# Patient Record
Sex: Female | Born: 1990
Health system: Southern US, Community
[De-identification: ages and names within clinical notes are randomized; demographics above are authoritative.]

## PROBLEM LIST (undated history)

## (undated) DIAGNOSIS — J45909 Unspecified asthma, uncomplicated: Secondary | ICD-10-CM

## (undated) HISTORY — PX: WISDOM TOOTH EXTRACTION: SHX21

---

## 2017-03-12 DIAGNOSIS — F988 Other specified behavioral and emotional disorders with onset usually occurring in childhood and adolescence: Secondary | ICD-10-CM | POA: Diagnosis not present

## 2017-03-12 DIAGNOSIS — E669 Obesity, unspecified: Secondary | ICD-10-CM | POA: Diagnosis not present

## 2017-03-12 DIAGNOSIS — M75101 Unspecified rotator cuff tear or rupture of right shoulder, not specified as traumatic: Secondary | ICD-10-CM | POA: Diagnosis not present

## 2017-03-12 DIAGNOSIS — Z7689 Persons encountering health services in other specified circumstances: Secondary | ICD-10-CM | POA: Diagnosis not present

## 2017-07-06 DIAGNOSIS — L03039 Cellulitis of unspecified toe: Secondary | ICD-10-CM | POA: Diagnosis not present

## 2017-09-30 DIAGNOSIS — M79671 Pain in right foot: Secondary | ICD-10-CM | POA: Diagnosis not present

## 2017-10-03 ENCOUNTER — Ambulatory Visit: Payer: Self-pay | Admitting: Family Medicine

## 2017-10-03 NOTE — Progress Notes (Deleted)
  Tawana ScaleZach [name] D.O.  [facility] [address] [place], [place] [address] Phone: [phone] Subjective:    I'm seeing this patient by the request  of:    CC:   BJY:NWGNFAOZHYHPI:Subjective  Rebekah ReusMargaret [name] is a 26 y.o. female coming in with complaint of ***  Onset-  Location Duration-  Character- Aggravating factors- Reliving factors-  Therapies tried-  Severity-     No past medical history on file. *** The histories are not reviewed yet. Please review them in the "History" navigator section and refresh this SmartLink. Social History   Socioeconomic History  . Marital status: Unknown    Spouse name: Not on file  . Number of children: Not on file  . Years of education: Not on file  . Highest education level: Not on file  Social Needs  . Financial resource strain: Not on file  . Food insecurity - worry: Not on file  . Food insecurity - inability: Not on file  . Transportation needs - medical: Not on file  . Transportation needs - non-medical: Not on file  Occupational History  . Not on file  Tobacco Use  . Smoking status: Not on file  Substance and Sexual Activity  . Alcohol use: Not on file  . Drug use: Not on file  . Sexual activity: Not on file  Other Topics Concern  . Not on file  Social History Narrative  . Not on file   Allergies not on file No family history on file.   Past medical history, social, surgical and family history all reviewed in electronic medical record.  No pertanent information unless stated regarding to the chief complaint.   Review of Systems:Review of systems updated and as accurate as of 10/03/17  No headache, visual changes, nausea, vomiting, diarrhea, constipation, dizziness, abdominal pain, skin rash, fevers, chills, night sweats, weight loss, swollen lymph nodes, body aches, joint swelling, muscle aches, chest pain, shortness of breath, mood changes.   Objective  There were no vitals taken for this visit. Systems examined below as  of 10/03/17   General: No apparent distress alert and oriented x3 mood and affect normal, dressed appropriately.  HEENT: Pupils equal, extraocular movements intact  Respiratory: Patient's speak in full sentences and does not appear short of breath  Cardiovascular: No lower extremity edema, non tender, no erythema  Skin: Warm dry intact with no signs of infection or rash on extremities or on axial skeleton.  Abdomen: Soft nontender  Neuro: Cranial nerves II through XII are intact, neurovascularly intact in all extremities with 2+ DTRs and 2+ pulses.  Lymph: No lymphadenopathy of posterior or anterior cervical chain or axillae bilaterally.  Gait normal with good balance and coordination.  MSK:  Non tender with full range of motion and good stability and symmetric strength and tone of shoulders, elbows, wrist, hip, knee and ankles bilaterally.     Impression and Recommendations:     This case required medical decision making of moderate complexity.      Note: This dictation was prepared with Dragon dictation along with smaller phrase technology. Any transcriptional errors that result from this process are unintentional.

## 2017-10-04 ENCOUNTER — Ambulatory Visit: Payer: Self-pay | Admitting: Family Medicine

## 2017-10-11 ENCOUNTER — Ambulatory Visit: Payer: BLUE CROSS/BLUE SHIELD | Admitting: Family Medicine

## 2017-10-11 ENCOUNTER — Ambulatory Visit: Payer: Self-pay

## 2017-10-11 ENCOUNTER — Other Ambulatory Visit: Payer: Self-pay

## 2017-10-11 ENCOUNTER — Encounter: Payer: Self-pay | Admitting: Family Medicine

## 2017-10-11 VITALS — BP 130/72 | HR 91 | Ht 61.0 in | Wt 185.0 lb

## 2017-10-11 DIAGNOSIS — M79671 Pain in right foot: Secondary | ICD-10-CM

## 2017-10-11 DIAGNOSIS — G5781 Other specified mononeuropathies of right lower limb: Secondary | ICD-10-CM | POA: Insufficient documentation

## 2017-10-11 DIAGNOSIS — G5761 Lesion of plantar nerve, right lower limb: Secondary | ICD-10-CM

## 2017-10-11 MED ORDER — GABAPENTIN 100 MG PO CAPS: 200.0000 mg | ORAL_CAPSULE | Freq: Every day | ORAL | 3 refills | Status: DC

## 2017-10-11 MED ORDER — DICLOFENAC SODIUM 2 % TD SOLN: 2.0000 g | Freq: Two times a day (BID) | TRANSDERMAL | 3 refills | Status: DC

## 2017-10-11 NOTE — Progress Notes (Signed)
Rebekah ScaleZach Celes [name] D.O. [facility] [address] [place], [place] [address] Phone: [phone] Subjective:      BJ:YNWGCC:foot  Pain  NFA:OZHYQMVHQIHPI:Subjective  Ferne ReusMargaret [name] is a 26 y.o. female coming in with complaint of right foot pain. Foot swells causing her to use different shoes. She gets sharp pains bilaterally at night. The right is more painful. Getting out the bed in the morning is the worse. Heat and Ice makes it painful. She got a boot but could not wear it do to work and driving. She has xrays. She does get numbness and tingling bilaterally but the right worse.   Onset- 1 month ago Location- Top of the foot and medial arch Character- Throbbing while she's working Aggravating factors- lateral pain with ankle circles  Reliving factors-seems to get a little better if not so much walking Severity-6 out of 10     History reviewed. No pertinent past medical history. History reviewed. No pertinent surgical history. Social History   Socioeconomic History  . Marital status: Married    Spouse name: None  . Number of children: None  . Years of education: None  . Highest education level: None  Social Needs  . Financial resource strain: None  . Food insecurity - worry: None  . Food insecurity - inability: None  . Transportation needs - medical: None  . Transportation needs - non-medical: None  Occupational History  . None  Tobacco Use  . Smoking status: Never Smoker  . Smokeless tobacco: Never Used  Substance and Sexual Activity  . Alcohol use: None  . Drug use: None  . Sexual activity: None  Other Topics Concern  . None  Social History Narrative  . None   Not on File History reviewed. No pertinent family history.   Past medical history, social, surgical and family history all reviewed in electronic medical record.  No pertanent information unless stated regarding to the chief complaint.   Review of Systems:Review of systems updated and as accurate as of 10/11/17  No  headache, visual changes, nausea, vomiting, diarrhea, constipation, dizziness, abdominal pain, skin rash, fevers, chills, night sweats, weight loss, swollen lymph nodes, body aches, joint swelling, muscle aches, chest pain, shortness of breath, mood changes.   Objective  Blood pressure 130/72, pulse 91, height 5\' 1"  (1.549 m), weight 185 lb (83.9 kg), SpO2 98 %. Systems examined below as of 10/11/17   General: No apparent distress alert and oriented x3 mood and affect normal, dressed appropriately.  HEENT: Pupils equal, extraocular movements intact  Respiratory: Patient's speak in full sentences and does not appear short of breath  Cardiovascular: No lower extremity edema, non tender, no erythema  Skin: Warm dry intact with no signs of infection or rash on extremities or on axial skeleton.  Abdomen: Soft nontender  Neuro: Cranial nerves II through XII are intact, neurovascularly intact in all extremities with 2+ DTRs and 2+ pulses.  Lymph: No lymphadenopathy of posterior or anterior cervical chain or axillae bilaterally.  Gait normal with good balance and coordination.  MSK:  Non tender with full range of motion and good stability and symmetric strength and tone of shoulders, elbows, wrist, hip, knee and ankles bilaterally.  Left foot exam shows the patient does have severe breakdown of the transverse arch.  Patient does have mild breakdown of the longitudinal arch.  Positive squeeze test.  Significant tenderness over the third fourth and fifth metatarsals.  Limited musculoskeletal ultrasound was performed and interpreted by Judi SaaZachary M Aryaman [name]  Limited ultrasound of patient's foot shows the patient does have a very large neuroma between the third and fourth metatarsal heads.  Significant pain to palpation.  [id number]; 15 additional minutes spent for Therapeutic exercises as stated in above notes.  This included exercises focusing on stretching, strengthening, with significant focus on eccentric  aspects.   Long term goals include an improvement in range of motion, strength, endurance as well as avoiding reinjury. Patient's frequency would include in 1-2 times a day, 3-5 times a week for a duration of 6-12 weeks. Exercises for the foot include:  Stretches to help lengthen the lower leg and plantar fascia areas Theraband exercises for the lower leg and ankle to help strengthen the surrounding area- dorsiflexion, plantarflexion, inversion, eversion Massage rolling on the plantar surface of the foot with a frozen bottle, tennis ball or golf ball therabband given  Towel or marble pick-ups to strengthen the plantar surface of the foot Weight bearing exercises to increase balance and overall stability   Proper technique shown and discussed handout in great detail with ATC.  All questions were discussed and answered.      Impression and Recommendations:     This case required medical decision making of moderate complexity.      Note: This dictation was prepared with Dragon dictation along with smaller phrase technology. Any transcriptional errors that result from this process are unintentional.

## 2017-10-11 NOTE — Patient Instructions (Signed)
Good to see you.  Ice 20 minutes 2 times daily. Usually after activity and before bed. Avoid being barefoot Good shoes with rigid bottom.  Rebekah HarnessKeen, Dansko, Merrell or New balance greater then 700 Spenco orthotics "total support" online would be great  pennsaid pinkie amount topically 2 times daily as needed.  Gabapentin 200mg  at night will help with the nerve pain  Exercises 3 times a week.  It will get better but will take 1-2 months See me again in 4 weeks Happy holidays!

## 2017-10-11 NOTE — Assessment & Plan Note (Signed)
Discussed the prognosis as well as the different treatment options.  Patient is elected with conservative therapy including over-the-counter orthotics, rigid sole shoes, topical anti-inflammatories, icing regimen, home exercises and patient work with Event organiserathletic trainer.  We discussed gabapentin at night to help with the nerve pain.  Patient will come back and see me again in 4-6 weeks.  If no improvement will consider custom orthotics, physical therapy and potential injections.

## 2017-11-07 NOTE — Progress Notes (Signed)
Tawana ScaleZach Smith D.O. [facility] [address] [place], [place] [address] Phone: [phone] Subjective:     CC: foot pain follow up   OZH:YQMVHQIONGHPI:Subjective  Ferne ReusMargaret Davis is a [age] y.o. female coming in with complaint of foot pain.  Was found to have more of a neuroma.  Patient was given topical anti-inflammatories, gabapentin, home exercises and we discussed over-the-counter orthotics.  Patient states continuing to have pain.  Patient says severe.  Does 20% better since taking the gabapentin.  Still hurts after standing for a long amount of time.   No past medical history on file. No past surgical history on file. Social History   Socioeconomic History  . Marital status: Married    Spouse name: Not on file  . Number of children: Not on file  . Years of education: Not on file  . Highest education level: Not on file  Social Needs  . Financial resource strain: Not on file  . Food insecurity - worry: Not on file  . Food insecurity - inability: Not on file  . Transportation needs - medical: Not on file  . Transportation needs - non-medical: Not on file  Occupational History  . Not on file  Tobacco Use  . Smoking status: Never Smoker  . Smokeless tobacco: Never Used  Substance and Sexual Activity  . Alcohol use: Not on file  . Drug use: Not on file  . Sexual activity: Not on file  Other Topics Concern  . Not on file  Social History Narrative  . Not on file   Not on File No family history on file.   Past medical history, social, surgical and family history all reviewed in electronic medical record.  No pertanent information unless stated regarding to the chief complaint.   Review of Systems:Review of systems updated and as accurate as of 11/07/17  No headache, visual changes, nausea, vomiting, diarrhea, constipation, dizziness, abdominal pain, skin rash, fevers, chills, night sweats, weight loss, swollen lymph nodes, body aches, joint swelling, muscle aches, chest pain,  shortness of breath, mood changes.   Objective  There were no vitals taken for this visit. Systems examined below as of 11/07/17   General: No apparent distress alert and oriented x3 mood and affect normal, dressed appropriately.  HEENT: Pupils equal, extraocular movements intact  Respiratory: Patient's speak in full sentences and does not appear short of breath  Cardiovascular: No lower extremity edema, non tender, no erythema  Skin: Warm dry intact with no signs of infection or rash on extremities or on axial skeleton.  Abdomen: Soft nontender  Neuro: Cranial nerves II through XII are intact, neurovascularly intact in all extremities with 2+ DTRs and 2+ pulses.  Lymph: No lymphadenopathy of posterior or anterior cervical chain or axillae bilaterally.  Gait normal with good balance and coordination.  MSK:  Non tender with full range of motion and good stability and symmetric strength and tone of shoulders, elbows, wrist, hip, knee and ankles bilaterally.  Exam shows pes planus still noted.  Overpronation.  Breakdown of the transverse arch.  Severe pain with positive squeeze test of the right foot.  Pain to palpation between the third and fourth metatarsal heads.  Procedure: Real-time Ultrasound Guided Injection of right foot neuroma Device: GE Logiq Q7 Ultrasound guided injection is preferred based studies that show increased duration, increased effect, greater accuracy, decreased procedural pain, increased response rate, and decreased cost with ultrasound guided versus blind injection.  Verbal informed consent obtained.  Time-out conducted.  Noted no overlying erythema, induration, or other signs of local infection.  Skin prepped in a sterile fashion.  Local anesthesia: Topical Ethyl chloride.  With sterile technique and under real time ultrasound guidance: With a 25-gauge half inch needle injected with 0.5 cc of 0.5% Marcaine and 0.5 cc of Kenalog 40 mg/dL Completed without difficulty    Pain immediately resolved suggesting accurate placement of the medication.  Advised to call if fevers/chills, erythema, induration, drainage, or persistent bleeding.  Images permanently stored and available for review in the ultrasound unit.  Impression: Technically successful ultrasound guided injection.    Impression and Recommendations:     This case required medical decision making of moderate complexity.      Note: This dictation was prepared with Dragon dictation along with smaller phrase technology. Any transcriptional errors that result from this process are unintentional.

## 2017-11-08 ENCOUNTER — Ambulatory Visit: Payer: Self-pay

## 2017-11-08 ENCOUNTER — Encounter: Payer: Self-pay | Admitting: Family Medicine

## 2017-11-08 ENCOUNTER — Ambulatory Visit: Payer: BLUE CROSS/BLUE SHIELD | Admitting: Family Medicine

## 2017-11-08 VITALS — BP 114/70 | HR 85 | Ht 61.0 in | Wt 185.0 lb

## 2017-11-08 DIAGNOSIS — M79673 Pain in unspecified foot: Secondary | ICD-10-CM

## 2017-11-08 DIAGNOSIS — G5781 Other specified mononeuropathies of right lower limb: Secondary | ICD-10-CM

## 2017-11-08 DIAGNOSIS — G5761 Lesion of plantar nerve, right lower limb: Secondary | ICD-10-CM

## 2017-11-08 NOTE — Assessment & Plan Note (Addendum)
Given injection today.  Tolerated the procedure well.  We discussed icing regimen and home exercises.  We discussed which activities of doing which wants to avoid.  Continue topical anti-inflammatories.  Continue the gabapentin.  Follow-up again in 4 weeks

## 2017-11-08 NOTE — Patient Instructions (Signed)
Good to see you  Rebekah [name] is your friend.  Continue the exercises and the gabapentin  Injected the nerve today  Good shoes iorn 65mg  with 500mg  of vitamin C at least 3 days before your period to 3 days after your period.  See me again in 4 -6 weeks

## 2017-11-22 DIAGNOSIS — F988 Other specified behavioral and emotional disorders with onset usually occurring in childhood and adolescence: Secondary | ICD-10-CM | POA: Diagnosis not present

## 2017-11-22 DIAGNOSIS — Z79899 Other long term (current) drug therapy: Secondary | ICD-10-CM | POA: Diagnosis not present

## 2017-12-06 ENCOUNTER — Ambulatory Visit: Payer: BLUE CROSS/BLUE SHIELD | Admitting: Family Medicine

## 2017-12-08 NOTE — Progress Notes (Deleted)
  Tawana ScaleZach Aquita Simmering D.O. [facility] [address] [place], [place] [address] Phone: [phone] Subjective:      CC foo pain   ONG:EXBMWUXLKGHPI:Subjective  Ferne ReusMargaret Davis is a 27 y.o. female coming in with complaint of pain.  Patient was having foot pain.  Found to have a neuroma third interspace of the right foot.  Given injection and started on gabapentin.  Patient states     No past medical history on file. No past surgical history on file. Social History   Socioeconomic History  . Marital status: Married    Spouse name: Not on file  . Number of children: Not on file  . Years of education: Not on file  . Highest education level: Not on file  Social Needs  . Financial resource strain: Not on file  . Food insecurity - worry: Not on file  . Food insecurity - inability: Not on file  . Transportation needs - medical: Not on file  . Transportation needs - non-medical: Not on file  Occupational History  . Not on file  Tobacco Use  . Smoking status: Never Smoker  . Smokeless tobacco: Never Used  Substance and Sexual Activity  . Alcohol use: Not on file  . Drug use: Not on file  . Sexual activity: Not on file  Other Topics Concern  . Not on file  Social History Narrative  . Not on file   Not on File No family history on file.   Past medical history, social, surgical and family history all reviewed in electronic medical record.  No pertanent information unless stated regarding to the chief complaint.   Review of Systems:Review of systems updated and as accurate as of 12/08/17  No headache, visual changes, nausea, vomiting, diarrhea, constipation, dizziness, abdominal pain, skin rash, fevers, chills, night sweats, weight loss, swollen lymph nodes, body aches, joint swelling, muscle aches, chest pain, shortness of breath, mood changes.   Objective  There were no vitals taken for this visit. Systems examined below as of 12/08/17   General: No apparent distress alert and  oriented x3 mood and affect normal, dressed appropriately.  HEENT: Pupils equal, extraocular movements intact  Respiratory: Patient's speak in full sentences and does not appear short of breath  Cardiovascular: No lower extremity edema, non tender, no erythema  Skin: Warm dry intact with no signs of infection or rash on extremities or on axial skeleton.  Abdomen: Soft nontender  Neuro: Cranial nerves II through XII are intact, neurovascularly intact in all extremities with 2+ DTRs and 2+ pulses.  Lymph: No lymphadenopathy of posterior or anterior cervical chain or axillae bilaterally.  Gait normal with good balance and coordination.  MSK:  Non tender with full range of motion and good stability and symmetric strength and tone of shoulders, elbows, wrist, hip, knee and ankles bilaterally.     Impression and Recommendations:     This case required medical decision making of moderate complexity.      Note: This dictation was prepared with Dragon dictation along with smaller phrase technology. Any transcriptional errors that result from this process are unintentional.

## 2017-12-10 ENCOUNTER — Ambulatory Visit: Payer: BLUE CROSS/BLUE SHIELD | Admitting: Family Medicine

## 2017-12-23 NOTE — Progress Notes (Signed)
Rebekah [name] [address] [place], [place] [address] Phone: [phone] Subjective:    I'm seeing this patient by the request  of:    CC: Right foot pain  OHY:[id number]  Rebekah [name] is a 27 y.o. female coming in with complaint of right foot pain.  Found to have a neuroma of the left foot.  Given injection January 2019.  Started on low-dose gabapentin.  Patient states that she has been wearing different shoes which has been helping. She feels that she is progressing in the right direction.   Patient also is having left hip pain. She said a shelf fell on her and some coworkers and she had to pull herself up and she felt some sharp pain in her hip. She has had pain with movement since then.  States is worsening day by day.  States that even going from laying to sitting up his agony.  Feels like she does not want to move.       No past medical history on file. No past surgical history on file. Social History   Socioeconomic History  . Marital status: Married    Spouse name: None  . Number of children: None  . Years of education: None  . Highest education level: None  Social Needs  . Financial resource strain: None  . Food insecurity - worry: None  . Food insecurity - inability: None  . Transportation needs - medical: None  . Transportation needs - non-medical: None  Occupational History  . None  Tobacco Use  . Smoking status: Never Smoker  . Smokeless tobacco: Never Used  Substance and Sexual Activity  . Alcohol use: None  . Drug use: None  . Sexual activity: None  Other Topics Concern  . None  Social History Narrative  . None   Not on File No family history on file.  No family history of autoimmune disease   Past medical history, social, surgical and family history all reviewed in electronic medical record.  No pertanent information unless stated regarding to the chief complaint.   Review of Systems:Review of systems updated and as  accurate as of 12/24/17  No headache, visual changes, nausea, vomiting, diarrhea, constipation, dizziness skin rash, fevers, chills, night sweats, weight loss, swollen lymph nodes, body aches, joint swelling, muscle aches, chest pain, shortness of breath, mood changes.  Positive change in bowel habits, decreased appetite, abdominal pain  Objective  Blood pressure 122/80, pulse 83, height 5\' 1"  (1.549 m), weight 185 lb (83.9 kg), SpO2 98 %. Systems examined below as of 12/24/17   General: No apparent distress alert and oriented x3 mood and affect normal, dressed appropriately.  HEENT: Pupils equal, extraocular movements intact  Respiratory: Patient's speak in full sentences and does not appear short of breath  Cardiovascular: No lower extremity edema, non tender, no erythema  Skin: Warm dry intact with no signs of infection or rash on extremities or on axial skeleton.  Abdomen: Bowel sounds positive in all 4 quadrants but severe tenderness to palpation in the left lower quadrant to even light palpation.  Patient does have a positive pain to percussion as well as rebound tenderness. Neuro: Cranial nerves II through XII are intact, neurovascularly intact in all extremities with 2+ DTRs and 2+ pulses.  Lymph: No lymphadenopathy of posterior or anterior cervical chain or axillae bilaterally.  Gait cautious splinting patient's left abdominal area. MSK:  Non tender with full range of motion and good stability and symmetric  strength and tone of shoulders, elbows, wrist, hip, knee and ankles bilaterally.      Impression and Recommendations:     This case required medical decision making of moderate complexity.      Note: This dictation was prepared with Dragon dictation along with smaller phrase technology. Any transcriptional errors that result from this process are unintentional.

## 2017-12-24 ENCOUNTER — Encounter: Payer: Self-pay | Admitting: Family Medicine

## 2017-12-24 ENCOUNTER — Ambulatory Visit: Payer: BLUE CROSS/BLUE SHIELD | Admitting: Family Medicine

## 2017-12-24 DIAGNOSIS — R1032 Left lower quadrant pain: Secondary | ICD-10-CM

## 2017-12-24 MED ORDER — GABAPENTIN 100 MG PO CAPS: 200.0000 mg | ORAL_CAPSULE | Freq: Every day | ORAL | 3 refills | Status: DC

## 2017-12-24 NOTE — Patient Instructions (Signed)
Good to see you  I think you will be fine.  Lets get a CT scan Abdomen and pelvis with and without contrast  They will call you  If it gets a lot worse then please go to the emergency room  See me again in 2 months otherwise for the foot.

## 2017-12-24 NOTE — Assessment & Plan Note (Signed)
Left lower abdominal quadrant pain.  Concern with patient having rebound tenderness.  Patient is presenting somewhat like an appendicitis just in the right quadrant.  Discussed diverticulitis is a possibility.  We discussed laboratory workup which patient declined.  Patient knows that if any fever, worsening pain, will need to go to the emergency room.  Will order a CT abdomen and pelvis at this point.  Follow-up again 2 months for the foot

## 2017-12-25 DIAGNOSIS — D72829 Elevated white blood cell count, unspecified: Secondary | ICD-10-CM | POA: Diagnosis not present

## 2017-12-25 DIAGNOSIS — M79604 Pain in right leg: Secondary | ICD-10-CM | POA: Diagnosis not present

## 2017-12-25 DIAGNOSIS — R112 Nausea with vomiting, unspecified: Secondary | ICD-10-CM | POA: Diagnosis not present

## 2017-12-25 DIAGNOSIS — N83291 Other ovarian cyst, right side: Secondary | ICD-10-CM | POA: Diagnosis not present

## 2017-12-25 DIAGNOSIS — R1032 Left lower quadrant pain: Secondary | ICD-10-CM | POA: Diagnosis not present

## 2017-12-25 DIAGNOSIS — R509 Fever, unspecified: Secondary | ICD-10-CM | POA: Diagnosis not present

## 2017-12-25 DIAGNOSIS — R0789 Other chest pain: Secondary | ICD-10-CM | POA: Diagnosis not present

## 2017-12-25 DIAGNOSIS — R079 Chest pain, unspecified: Secondary | ICD-10-CM | POA: Diagnosis not present

## 2017-12-25 DIAGNOSIS — R1084 Generalized abdominal pain: Secondary | ICD-10-CM | POA: Diagnosis not present

## 2017-12-25 DIAGNOSIS — R9431 Abnormal electrocardiogram [ECG] [EKG]: Secondary | ICD-10-CM | POA: Diagnosis not present

## 2017-12-25 DIAGNOSIS — M549 Dorsalgia, unspecified: Secondary | ICD-10-CM | POA: Diagnosis not present

## 2018-01-08 DIAGNOSIS — Z09 Encounter for follow-up examination after completed treatment for conditions other than malignant neoplasm: Secondary | ICD-10-CM | POA: Diagnosis not present

## 2018-01-08 DIAGNOSIS — R1032 Left lower quadrant pain: Secondary | ICD-10-CM | POA: Diagnosis not present

## 2018-01-14 ENCOUNTER — Encounter: Payer: Self-pay | Admitting: Family Medicine

## 2018-03-14 DIAGNOSIS — M545 Low back pain: Secondary | ICD-10-CM | POA: Diagnosis not present

## 2018-03-14 DIAGNOSIS — R300 Dysuria: Secondary | ICD-10-CM | POA: Diagnosis not present

## 2018-06-06 DIAGNOSIS — Z1283 Encounter for screening for malignant neoplasm of skin: Secondary | ICD-10-CM | POA: Diagnosis not present

## 2018-06-06 DIAGNOSIS — D485 Neoplasm of uncertain behavior of skin: Secondary | ICD-10-CM | POA: Diagnosis not present

## 2018-06-06 DIAGNOSIS — L578 Other skin changes due to chronic exposure to nonionizing radiation: Secondary | ICD-10-CM | POA: Diagnosis not present

## 2018-06-06 DIAGNOSIS — L02611 Cutaneous abscess of right foot: Secondary | ICD-10-CM | POA: Diagnosis not present

## 2018-07-02 ENCOUNTER — Other Ambulatory Visit: Payer: Self-pay

## 2018-07-02 ENCOUNTER — Ambulatory Visit
Admission: EM | Admit: 2018-07-02 | Discharge: 2018-07-02 | Disposition: A | Discharge status: Home / Self Care | Payer: BLUE CROSS/BLUE SHIELD | Attending: Family Medicine | Admitting: Family Medicine

## 2018-07-02 ENCOUNTER — Encounter: Payer: Self-pay | Admitting: Emergency Medicine

## 2018-07-02 ENCOUNTER — Ambulatory Visit (INDEPENDENT_AMBULATORY_CARE_PROVIDER_SITE_OTHER): Payer: BLUE CROSS/BLUE SHIELD

## 2018-07-02 DIAGNOSIS — M722 Plantar fascial fibromatosis: Secondary | ICD-10-CM | POA: Diagnosis not present

## 2018-07-02 DIAGNOSIS — M79671 Pain in right foot: Secondary | ICD-10-CM

## 2018-07-02 DIAGNOSIS — L84 Corns and callosities: Secondary | ICD-10-CM | POA: Diagnosis not present

## 2018-07-02 NOTE — ED Triage Notes (Signed)
Patient states that she has been having pain at the bottom of her right foot for the past 2-3 weeks.  Patient states that she stepped on some gravel about 3 weeks ago and not sure if she still has some stuck in her foot.

## 2018-07-02 NOTE — ED Provider Notes (Signed)
[facility]    CSN: [id number] Arrival date & time: 07/02/18  1631     History   Chief Complaint Chief Complaint  Patient presents with  . Foot Pain    right    HPI Rebekah [name] is a 27 y.o. female.   27 yo female with a c/o foot pain to the bottom of the foot. States about 2 months ago she walked on some gravel and had some foreign bodies stuck in her foot. States she has a spot on the bottom of her foot that's been very painful and has made her change her gait to avoid pressure to the area.   The history is provided by the patient.  Foot Pain     History reviewed. No pertinent past medical history.  There are no active problems to display for this patient.   Past Surgical History:  Procedure Laterality Date  . WISDOM TOOTH EXTRACTION      OB History   None      Home Medications    Prior to Admission medications   Medication Sig Start Date End Date Taking? Authorizing Provider  amphetamine-dextroamphetamine (ADDERALL XR) 20 MG 24 hr capsule Take by mouth. 04/30/18  Yes [provider]  gabapentin (NEURONTIN) 100 MG capsule Take by mouth. 11/15/17   [provider]    Family History History reviewed. No pertinent family history.  Social History Social History   Tobacco Use  . Smoking status: Never Smoker  . Smokeless tobacco: Never Used  Substance Use Topics  . Alcohol use: Yes  . Drug use: Not on file     Allergies   Sumatriptan succinate   Review of Systems Review of Systems   Physical Exam Triage Vital Signs ED Triage Vitals  Enc Vitals Group     BP 07/02/18 1654 120/88     Pulse Rate 07/02/18 1654 89     Resp 07/02/18 1654 16     Temp 07/02/18 1654 98.6 F (37 C)     Temp Source 07/02/18 1654 Oral     SpO2 07/02/18 1654 99 %     Weight 07/02/18 1650 170 lb (77.1 kg)     Height 07/02/18 1650 5\' 1"  (1.549 m)     Head Circumference --      Peak Flow --      Pain Score 07/02/18 1650 7     Pain  Loc --      Pain Edu? --      Excl. in GC? --    No data found.  Updated Vital Signs BP 120/88 (BP Location: Left Arm)   Pulse 89   Temp 98.6 F (37 C) (Oral)   Resp 16   Ht 5\' 1"  (1.549 m)   Wt 77.1 kg   LMP 07/01/2018 (Exact Date)   SpO2 99%   BMI 32.12 kg/m   Visual Acuity Right Eye Distance:   Left Eye Distance:   Bilateral Distance:    Right Eye Near:   Left Eye Near:    Bilateral Near:     Physical Exam  Constitutional: She appears well-developed and well-nourished. No distress.  Musculoskeletal:       Right foot: There is tenderness (along plantar aspect of foot; and at corn appearing isolated skin lesion on plantar skin). There is normal range of motion, no bony tenderness, no swelling, normal capillary refill, no crepitus, no deformity and no laceration.  Skin: She is not diaphoretic.  Nursing note and vitals reviewed.  UC Treatments / Results  Labs (all labs ordered are listed, but only abnormal results are displayed) Labs Reviewed - No data to display  EKG None  Radiology Dg Foot Complete Right  Result Date: 07/02/2018 CLINICAL DATA:  Persistent plantar foot pain for the past 2-3 weeks after stepping on gravel. EXAM: RIGHT FOOT COMPLETE - 3+ VIEW COMPARISON:  None. FINDINGS: There is no evidence of fracture or dislocation. There is no evidence of arthropathy or other focal bone abnormality. Tiny plantar enthesophyte. Soft tissues are unremarkable. No radiopaque foreign body identified. IMPRESSION: Negative. Electronically Signed   By: Obie Dredge M.D.   On: 07/02/2018 18:03    Procedures Procedures (including critical care time)  Medications Ordered in UC Medications - No data to display  Initial Impression / Assessment and Plan / UC Course  I have reviewed the triage vital signs and the nursing notes.  Pertinent labs & imaging results that were available during my care of the patient were reviewed by me and considered in my medical decision  making (see chart for details).      Final Clinical Impressions(s) / UC Diagnoses   Final diagnoses:  Corn of foot  Plantar fasciitis of right foot    ED Prescriptions    None     1. Foot x-ray result (negative for foreign body) and diagnoses reviewed with patient 2. Recommend supportive treatment with otc corn/callus pads, otc plantar fascitis shoe insert and otc analgesics/NSAIDS 3. Follow up with podiatrist (info given for Dr. Ether Griffins)   4. Follow-up prn if symptoms worsen or don't improve   Controlled Substance Prescriptions [place] Controlled Substance Registry consulted? Not Applicable   Payton Mccallum, MD 07/02/18 [phone]

## 2018-07-03 ENCOUNTER — Encounter: Payer: Self-pay | Admitting: Family Medicine

## 2018-07-23 DIAGNOSIS — M79671 Pain in right foot: Secondary | ICD-10-CM | POA: Diagnosis not present

## 2018-07-23 DIAGNOSIS — D2371 Other benign neoplasm of skin of right lower limb, including hip: Secondary | ICD-10-CM | POA: Diagnosis not present

## 2018-09-23 DIAGNOSIS — Z23 Encounter for immunization: Secondary | ICD-10-CM | POA: Diagnosis not present

## 2018-09-23 DIAGNOSIS — E669 Obesity, unspecified: Secondary | ICD-10-CM | POA: Diagnosis not present

## 2018-09-23 DIAGNOSIS — Z79899 Other long term (current) drug therapy: Secondary | ICD-10-CM | POA: Diagnosis not present

## 2018-09-23 DIAGNOSIS — Z131 Encounter for screening for diabetes mellitus: Secondary | ICD-10-CM | POA: Diagnosis not present

## 2018-09-23 DIAGNOSIS — Z0000 Encounter for general adult medical examination without abnormal findings: Secondary | ICD-10-CM | POA: Diagnosis not present

## 2018-09-23 DIAGNOSIS — F988 Other specified behavioral and emotional disorders with onset usually occurring in childhood and adolescence: Secondary | ICD-10-CM | POA: Diagnosis not present

## 2019-01-11 NOTE — Progress Notes (Deleted)
Rebekah [name] [address] [place], [place] [address] Phone: [phone] Subjective:    I'm seeing this patient by the request  of:    CC: Right foot pain  [id number]  Rebekah [name] is a 28 y.o. female coming in with complaint of ***  Onset-  Location Duration-  Character- Aggravating factors- Reliving factors-  Therapies tried-  Severity-     No past medical history on file. Past Surgical History:  Procedure Laterality Date  . WISDOM TOOTH EXTRACTION     Social History   Socioeconomic History  . Marital status: Married    Spouse name: Not on file  . Number of children: Not on file  . Years of education: Not on file  . Highest education level: Not on file  Occupational History  . Not on file  Social Needs  . Financial resource strain: Not on file  . Food insecurity:    Worry: Not on file    Inability: Not on file  . Transportation needs:    Medical: Not on file    Non-medical: Not on file  Tobacco Use  . Smoking status: Never Smoker  . Smokeless tobacco: Never Used  Substance and Sexual Activity  . Alcohol use: Yes  . Drug use: Not on file  . Sexual activity: Not on file  Lifestyle  . Physical activity:    Days per week: Not on file    Minutes per session: Not on file  . Stress: Not on file  Relationships  . Social connections:    Talks on phone: Not on file    Gets together: Not on file    Attends religious service: Not on file    Active member of club or organization: Not on file    Attends meetings of clubs or organizations: Not on file    Relationship status: Not on file  Other Topics Concern  . Not on file  Social History Narrative   ** Merged History Encounter **       Allergies  Allergen Reactions  . Sumatriptan Succinate Other (See Comments)    Muscle spasms   No family history on file.       Current Outpatient Medications (Other):  .  amphetamine-dextroamphetamine (ADDERALL XR) 20 MG 24 hr  capsule, Take by mouth. .  Diclofenac Sodium (PENNSAID) 2 % SOLN, Place 2 g onto the skin 2 (two) times daily. Marland Kitchen  gabapentin (NEURONTIN) 100 MG capsule, Take 2 capsules (200 mg total) by mouth at bedtime. .  gabapentin (NEURONTIN) 100 MG capsule, Take by mouth.    Past medical history, social, surgical and family history all reviewed in electronic medical record.  No pertanent information unless stated regarding to the chief complaint.   Review of Systems:  No headache, visual changes, nausea, vomiting, diarrhea, constipation, dizziness, abdominal pain, skin rash, fevers, chills, night sweats, weight loss, swollen lymph nodes, body aches, joint swelling, muscle aches, chest pain, shortness of breath, mood changes.   Objective  There were no vitals taken for this visit. Systems examined below as of    General: No apparent distress alert and oriented x3 mood and affect normal, dressed appropriately.  HEENT: Pupils equal, extraocular movements intact  Respiratory: Patient's speak in full sentences and does not appear short of breath  Cardiovascular: No lower extremity edema, non tender, no erythema  Skin: Warm dry intact with no signs of infection or rash on extremities or on axial skeleton.  Abdomen: Soft nontender  Neuro: Cranial nerves II through XII are intact, neurovascularly intact in all extremities with 2+ DTRs and 2+ pulses.  Lymph: No lymphadenopathy of posterior or anterior cervical chain or axillae bilaterally.  Gait normal with good balance and coordination.  MSK:  Non tender with full range of motion and good stability and symmetric strength and tone of shoulders, elbows, wrist, hip, knee and ankles bilaterally.  Foot exam shows   Impression and Recommendations:     This case required medical decision making of moderate complexity. The above documentation has been reviewed and is accurate and complete Judi Saa, DO       Note: This dictation was prepared with  Dragon dictation along with smaller phrase technology. Any transcriptional errors that result from this process are unintentional.

## 2019-01-13 ENCOUNTER — Ambulatory Visit: Payer: BLUE CROSS/BLUE SHIELD | Admitting: Family Medicine

## 2019-01-20 DIAGNOSIS — Z3401 Encounter for supervision of normal first pregnancy, first trimester: Secondary | ICD-10-CM | POA: Diagnosis not present

## 2019-02-13 DIAGNOSIS — Z3401 Encounter for supervision of normal first pregnancy, first trimester: Secondary | ICD-10-CM | POA: Diagnosis not present

## 2019-02-13 DIAGNOSIS — Z3A12 12 weeks gestation of pregnancy: Secondary | ICD-10-CM | POA: Diagnosis not present

## 2019-02-13 DIAGNOSIS — O351XX0 Maternal care for (suspected) chromosomal abnormality in fetus, not applicable or unspecified: Secondary | ICD-10-CM | POA: Diagnosis not present

## 2019-02-13 DIAGNOSIS — Z36 Encounter for antenatal screening for chromosomal anomalies: Secondary | ICD-10-CM | POA: Diagnosis not present

## 2019-02-13 DIAGNOSIS — O3680X0 Pregnancy with inconclusive fetal viability, not applicable or unspecified: Secondary | ICD-10-CM | POA: Diagnosis not present

## 2019-04-03 DIAGNOSIS — O99212 Obesity complicating pregnancy, second trimester: Secondary | ICD-10-CM | POA: Diagnosis not present

## 2019-04-03 DIAGNOSIS — Z363 Encounter for antenatal screening for malformations: Secondary | ICD-10-CM | POA: Diagnosis not present

## 2019-04-03 DIAGNOSIS — E669 Obesity, unspecified: Secondary | ICD-10-CM | POA: Diagnosis not present

## 2019-04-21 DIAGNOSIS — E669 Obesity, unspecified: Secondary | ICD-10-CM | POA: Diagnosis not present

## 2019-04-21 DIAGNOSIS — F909 Attention-deficit hyperactivity disorder, unspecified type: Secondary | ICD-10-CM | POA: Diagnosis not present

## 2019-04-21 DIAGNOSIS — O99211 Obesity complicating pregnancy, first trimester: Secondary | ICD-10-CM | POA: Diagnosis not present

## 2019-04-21 DIAGNOSIS — Z3009 Encounter for other general counseling and advice on contraception: Secondary | ICD-10-CM | POA: Diagnosis not present

## 2019-05-08 DIAGNOSIS — O99212 Obesity complicating pregnancy, second trimester: Secondary | ICD-10-CM | POA: Diagnosis not present

## 2019-05-08 DIAGNOSIS — Z362 Encounter for other antenatal screening follow-up: Secondary | ICD-10-CM | POA: Diagnosis not present

## 2019-05-08 DIAGNOSIS — Z3A24 24 weeks gestation of pregnancy: Secondary | ICD-10-CM | POA: Diagnosis not present

## 2019-05-26 DIAGNOSIS — Z7189 Other specified counseling: Secondary | ICD-10-CM | POA: Diagnosis not present

## 2019-05-26 DIAGNOSIS — Z3403 Encounter for supervision of normal first pregnancy, third trimester: Secondary | ICD-10-CM | POA: Diagnosis not present

## 2019-05-26 DIAGNOSIS — Z23 Encounter for immunization: Secondary | ICD-10-CM | POA: Diagnosis not present

## 2019-06-30 DIAGNOSIS — H5213 Myopia, bilateral: Secondary | ICD-10-CM | POA: Diagnosis not present

## 2019-07-09 DIAGNOSIS — J4520 Mild intermittent asthma, uncomplicated: Secondary | ICD-10-CM | POA: Diagnosis not present

## 2019-07-09 DIAGNOSIS — O368130 Decreased fetal movements, third trimester, not applicable or unspecified: Secondary | ICD-10-CM | POA: Diagnosis not present

## 2019-07-09 DIAGNOSIS — Z3A33 33 weeks gestation of pregnancy: Secondary | ICD-10-CM | POA: Diagnosis not present

## 2019-07-09 DIAGNOSIS — J45909 Unspecified asthma, uncomplicated: Secondary | ICD-10-CM | POA: Diagnosis not present

## 2019-07-23 DIAGNOSIS — E669 Obesity, unspecified: Secondary | ICD-10-CM | POA: Diagnosis not present

## 2019-07-23 DIAGNOSIS — Z3A35 35 weeks gestation of pregnancy: Secondary | ICD-10-CM | POA: Diagnosis not present

## 2019-07-23 DIAGNOSIS — O99213 Obesity complicating pregnancy, third trimester: Secondary | ICD-10-CM | POA: Diagnosis not present

## 2019-07-23 DIAGNOSIS — O368130 Decreased fetal movements, third trimester, not applicable or unspecified: Secondary | ICD-10-CM | POA: Diagnosis not present

## 2019-07-28 DIAGNOSIS — Z3403 Encounter for supervision of normal first pregnancy, third trimester: Secondary | ICD-10-CM | POA: Diagnosis not present

## 2019-08-05 DIAGNOSIS — N118 Other chronic tubulo-interstitial nephritis: Secondary | ICD-10-CM | POA: Diagnosis not present

## 2019-08-05 DIAGNOSIS — Z3A37 37 weeks gestation of pregnancy: Secondary | ICD-10-CM | POA: Diagnosis not present

## 2019-08-05 DIAGNOSIS — K5900 Constipation, unspecified: Secondary | ICD-10-CM | POA: Diagnosis not present

## 2019-08-05 DIAGNOSIS — R109 Unspecified abdominal pain: Secondary | ICD-10-CM | POA: Diagnosis not present

## 2019-08-05 DIAGNOSIS — E669 Obesity, unspecified: Secondary | ICD-10-CM | POA: Diagnosis not present

## 2019-08-05 DIAGNOSIS — J4520 Mild intermittent asthma, uncomplicated: Secondary | ICD-10-CM | POA: Diagnosis not present

## 2019-08-05 DIAGNOSIS — O368130 Decreased fetal movements, third trimester, not applicable or unspecified: Secondary | ICD-10-CM | POA: Diagnosis not present

## 2019-08-05 DIAGNOSIS — O99513 Diseases of the respiratory system complicating pregnancy, third trimester: Secondary | ICD-10-CM | POA: Diagnosis not present

## 2019-08-05 DIAGNOSIS — N12 Tubulo-interstitial nephritis, not specified as acute or chronic: Secondary | ICD-10-CM | POA: Diagnosis not present

## 2019-08-05 DIAGNOSIS — Z20828 Contact with and (suspected) exposure to other viral communicable diseases: Secondary | ICD-10-CM | POA: Diagnosis not present

## 2019-08-05 DIAGNOSIS — Z87891 Personal history of nicotine dependence: Secondary | ICD-10-CM | POA: Diagnosis not present

## 2019-08-05 DIAGNOSIS — M549 Dorsalgia, unspecified: Secondary | ICD-10-CM | POA: Diagnosis not present

## 2019-08-05 DIAGNOSIS — F909 Attention-deficit hyperactivity disorder, unspecified type: Secondary | ICD-10-CM | POA: Diagnosis not present

## 2019-08-05 DIAGNOSIS — O99343 Other mental disorders complicating pregnancy, third trimester: Secondary | ICD-10-CM | POA: Diagnosis not present

## 2019-08-05 DIAGNOSIS — O26893 Other specified pregnancy related conditions, third trimester: Secondary | ICD-10-CM | POA: Diagnosis not present

## 2019-08-05 DIAGNOSIS — J45909 Unspecified asthma, uncomplicated: Secondary | ICD-10-CM | POA: Diagnosis not present

## 2019-08-05 DIAGNOSIS — O2303 Infections of kidney in pregnancy, third trimester: Secondary | ICD-10-CM | POA: Diagnosis not present

## 2019-08-05 DIAGNOSIS — O99213 Obesity complicating pregnancy, third trimester: Secondary | ICD-10-CM | POA: Diagnosis not present

## 2019-08-05 DIAGNOSIS — N1330 Unspecified hydronephrosis: Secondary | ICD-10-CM | POA: Diagnosis not present

## 2019-08-05 DIAGNOSIS — B951 Streptococcus, group B, as the cause of diseases classified elsewhere: Secondary | ICD-10-CM | POA: Diagnosis not present

## 2019-08-05 DIAGNOSIS — R1030 Lower abdominal pain, unspecified: Secondary | ICD-10-CM | POA: Diagnosis not present

## 2019-08-05 DIAGNOSIS — Z7951 Long term (current) use of inhaled steroids: Secondary | ICD-10-CM | POA: Diagnosis not present

## 2019-08-12 DIAGNOSIS — R030 Elevated blood-pressure reading, without diagnosis of hypertension: Secondary | ICD-10-CM | POA: Diagnosis not present

## 2019-08-12 DIAGNOSIS — Z3403 Encounter for supervision of normal first pregnancy, third trimester: Secondary | ICD-10-CM | POA: Diagnosis not present

## 2019-08-12 DIAGNOSIS — O26893 Other specified pregnancy related conditions, third trimester: Secondary | ICD-10-CM | POA: Diagnosis not present

## 2019-08-12 DIAGNOSIS — Z3A38 38 weeks gestation of pregnancy: Secondary | ICD-10-CM | POA: Diagnosis not present

## 2019-08-12 DIAGNOSIS — O99891 Other specified diseases and conditions complicating pregnancy: Secondary | ICD-10-CM | POA: Diagnosis not present

## 2019-08-13 DIAGNOSIS — O133 Gestational [pregnancy-induced] hypertension without significant proteinuria, third trimester: Secondary | ICD-10-CM | POA: Diagnosis not present

## 2019-08-13 DIAGNOSIS — Z8669 Personal history of other diseases of the nervous system and sense organs: Secondary | ICD-10-CM | POA: Diagnosis not present

## 2019-08-13 DIAGNOSIS — R030 Elevated blood-pressure reading, without diagnosis of hypertension: Secondary | ICD-10-CM | POA: Diagnosis not present

## 2019-08-13 DIAGNOSIS — O99891 Other specified diseases and conditions complicating pregnancy: Secondary | ICD-10-CM | POA: Diagnosis not present

## 2019-08-13 DIAGNOSIS — Z3A38 38 weeks gestation of pregnancy: Secondary | ICD-10-CM | POA: Diagnosis not present

## 2019-08-14 DIAGNOSIS — O133 Gestational [pregnancy-induced] hypertension without significant proteinuria, third trimester: Secondary | ICD-10-CM | POA: Diagnosis not present

## 2019-08-14 DIAGNOSIS — Z01812 Encounter for preprocedural laboratory examination: Secondary | ICD-10-CM | POA: Diagnosis not present

## 2019-08-14 DIAGNOSIS — Z20828 Contact with and (suspected) exposure to other viral communicable diseases: Secondary | ICD-10-CM | POA: Diagnosis not present

## 2019-08-17 DIAGNOSIS — J4520 Mild intermittent asthma, uncomplicated: Secondary | ICD-10-CM | POA: Diagnosis not present

## 2019-08-17 DIAGNOSIS — O99820 Streptococcus B carrier state complicating pregnancy: Secondary | ICD-10-CM | POA: Diagnosis not present

## 2019-08-17 DIAGNOSIS — O9952 Diseases of the respiratory system complicating childbirth: Secondary | ICD-10-CM | POA: Diagnosis not present

## 2019-08-17 DIAGNOSIS — O99344 Other mental disorders complicating childbirth: Secondary | ICD-10-CM | POA: Diagnosis not present

## 2019-08-17 DIAGNOSIS — O134 Gestational [pregnancy-induced] hypertension without significant proteinuria, complicating childbirth: Secondary | ICD-10-CM | POA: Diagnosis not present

## 2019-08-17 DIAGNOSIS — O99824 Streptococcus B carrier state complicating childbirth: Secondary | ICD-10-CM | POA: Diagnosis not present

## 2019-08-17 DIAGNOSIS — O133 Gestational [pregnancy-induced] hypertension without significant proteinuria, third trimester: Secondary | ICD-10-CM | POA: Diagnosis not present

## 2019-08-17 DIAGNOSIS — F909 Attention-deficit hyperactivity disorder, unspecified type: Secondary | ICD-10-CM | POA: Diagnosis not present

## 2019-08-17 DIAGNOSIS — Z3A38 38 weeks gestation of pregnancy: Secondary | ICD-10-CM | POA: Diagnosis not present

## 2019-08-17 DIAGNOSIS — E669 Obesity, unspecified: Secondary | ICD-10-CM | POA: Diagnosis not present

## 2019-08-17 DIAGNOSIS — Z23 Encounter for immunization: Secondary | ICD-10-CM | POA: Diagnosis not present

## 2019-08-17 DIAGNOSIS — Z3A39 39 weeks gestation of pregnancy: Secondary | ICD-10-CM | POA: Diagnosis not present

## 2019-08-17 DIAGNOSIS — O99214 Obesity complicating childbirth: Secondary | ICD-10-CM | POA: Diagnosis not present

## 2020-12-05 NOTE — Progress Notes (Signed)
[name] [address] [place] [address] Phone: [phone] Subjective:   Rebekah [name], am serving as a scribe for Dr. Antoine Primas. This visit occurred during the SARS-CoV-2 public health emergency.  Safety protocols were in place, including screening questions prior to the visit, additional usage of staff PPE, and extensive cleaning of exam room while observing appropriate contact time as indicated for disinfecting solutions.   I'm seeing this patient by the request  of:  Patient, No Pcp Per  CC: left ankle and foot pain   [id number]  Rebekah [name] is a 30 y.o. female coming in with complaint of left ankle pain. Last seen in 2019 for LLQ pain. Patient states the pain has been going on about a month but is not getting any better. States it feels unstable and sore pain (like the after pain when you roll an ankle). No MOI.  Onset- 1 month Location Left side of foot/ankle to the pinky toe Character- sore and unstable Aggravating factors- blankets touches, sitting, driving Reliving factors- none Therapies tried- flexeril Severity- at worse 8-9/10: now 6-7/10 Patient denies any swelling, any redness.    History reviewed. No pertinent past medical history. Past Surgical History:  Procedure Laterality Date  . WISDOM TOOTH EXTRACTION     Social History   Socioeconomic History  . Marital status: Married    Spouse name: Not on file  . Number of children: Not on file  . Years of education: Not on file  . Highest education level: Not on file  Occupational History  . Not on file  Tobacco Use  . Smoking status: Never Smoker  . Smokeless tobacco: Never Used  Substance and Sexual Activity  . Alcohol use: Yes  . Drug use: Not on file  . Sexual activity: Not on file  Other Topics Concern  . Not on file  Social History Narrative   ** Merged History Encounter **       Social Determinants of Health   Financial Resource Strain: Not  on file  Food Insecurity: Not on file  Transportation Needs: Not on file  Physical Activity: Not on file  Stress: Not on file  Social Connections: Not on file   Allergies  Allergen Reactions  . Sumatriptan Succinate Other (See Comments)    Muscle spasms   History reviewed. No pertinent family history.    Current Outpatient Medications (Respiratory):  .  albuterol (VENTOLIN HFA) 108 (90 Base) MCG/ACT inhaler, INHALE 2 PUFFS BY MOUTH EVERY 6 HOURS ASNEEDED WHEEZING .  budesonide-formoterol (SYMBICORT) 80-4.5 MCG/ACT inhaler, Inhale into the lungs.      Reviewed prior external information including notes and imaging from  primary care provider As well as notes that were available from care everywhere and other healthcare systems.  Past medical history, social, surgical and family history all reviewed in electronic medical record.  No pertanent information unless stated regarding to the chief complaint.   Review of Systems:  No headache, visual changes, nausea, vomiting, diarrhea, constipation, dizziness, abdominal pain, skin rash, fevers, chills, night sweats, weight loss, swollen lymph nodes, body aches, joint swelling, chest pain, shortness of breath, mood changes. POSITIVE muscle aches  Objective  Blood pressure 110/60, pulse 78, height 5\' 1"  (1.549 m), weight 191 lb (86.6 kg), SpO2 99 %.   General: No apparent distress alert and oriented x3 mood and affect normal, dressed appropriately.  HEENT: Pupils equal, extraocular movements intact  Respiratory: Patient's speak in full sentences and does  not appear short of breath  Cardiovascular: No lower extremity edema, non tender, no erythema  Gait normal with good balance and coordination.  MSK: Left ankle exam shows the patient is tender to palpation on the lateral aspect near the peroneal tendons that seems to go up the calf.  Patient's pain is out of proportion to the amount of palpation.  Most of the pain is on the lateral and  posterior aspect of the ankle.  No pain over the malleolus itself though.  Mild limitation in dorsiflexion and plantarflexion secondary to pain.  Limited musculoskeletal ultrasound was performed and interpreted by Judi Saa  Limited ultrasound of patient's left ankle shows hypoechoic changes within the peroneal tendon sheath noted.  No true tear appreciated.  Patient's lateral malleolus does have some mild irregularity that seems to be active avulsion fracture potentially.  Patient has trace effusion of the ankle joint noted. Impression: Peroneal tendinitis with questionable small ankle effusion.  [id number]; 15 additional minutes spent for Therapeutic exercises as stated in above notes.  This included exercises focusing on stretching, strengthening, with significant focus on eccentric aspects.   Long term goals include an improvement in range of motion, strength, endurance as well as avoiding reinjury. Patient's frequency would include in 1-2 times a day, 3-5 times a week for a duration of 6-12 weeks. Ankle strengthening that included:  Basic range of motion exercises to allow proper full motion at ankle Stretching of the lower leg and hamstrings  Theraband exercises for the lower leg - inversion, eversion, dorsiflexion and plantarflexion each to be completed with a theraband Balance exercises to increase proprioception Weight bearing exercises to increase strength and balance  Proper technique shown and discussed handout in great detail with ATC.  All questions were discussed and answered.       Impression and Recommendations:     The above documentation has been reviewed and is accurate and complete Judi Saa, DO

## 2020-12-06 ENCOUNTER — Ambulatory Visit: Payer: BLUE CROSS/BLUE SHIELD | Admitting: Family Medicine

## 2020-12-06 ENCOUNTER — Ambulatory Visit (INDEPENDENT_AMBULATORY_CARE_PROVIDER_SITE_OTHER): Payer: BC Managed Care – PPO

## 2020-12-06 ENCOUNTER — Ambulatory Visit (HOSPITAL_COMMUNITY)
Admission: RE | Admit: 2020-12-06 | Discharge: 2020-12-06 | Disposition: A | Discharge status: Home / Self Care | Payer: BC Managed Care – PPO | Source: Ambulatory Visit | Attending: Cardiology | Admitting: Cardiology

## 2020-12-06 ENCOUNTER — Ambulatory Visit: Payer: Self-pay

## 2020-12-06 ENCOUNTER — Encounter: Payer: Self-pay | Admitting: Family Medicine

## 2020-12-06 ENCOUNTER — Other Ambulatory Visit: Payer: Self-pay

## 2020-12-06 VITALS — BP 110/60 | HR 78 | Ht 61.0 in | Wt 191.0 lb

## 2020-12-06 DIAGNOSIS — M25572 Pain in left ankle and joints of left foot: Secondary | ICD-10-CM

## 2020-12-06 IMAGING — DX DG FOOT 2V*L*
2 series · 2 of 2 positions shown · non-contrast
Comparison: None.

CLINICAL DATA: Lateral foot and ankle pain for 1 month. No known
injury.

EXAM:
LEFT FOOT - 2 VIEW

[foot ap]
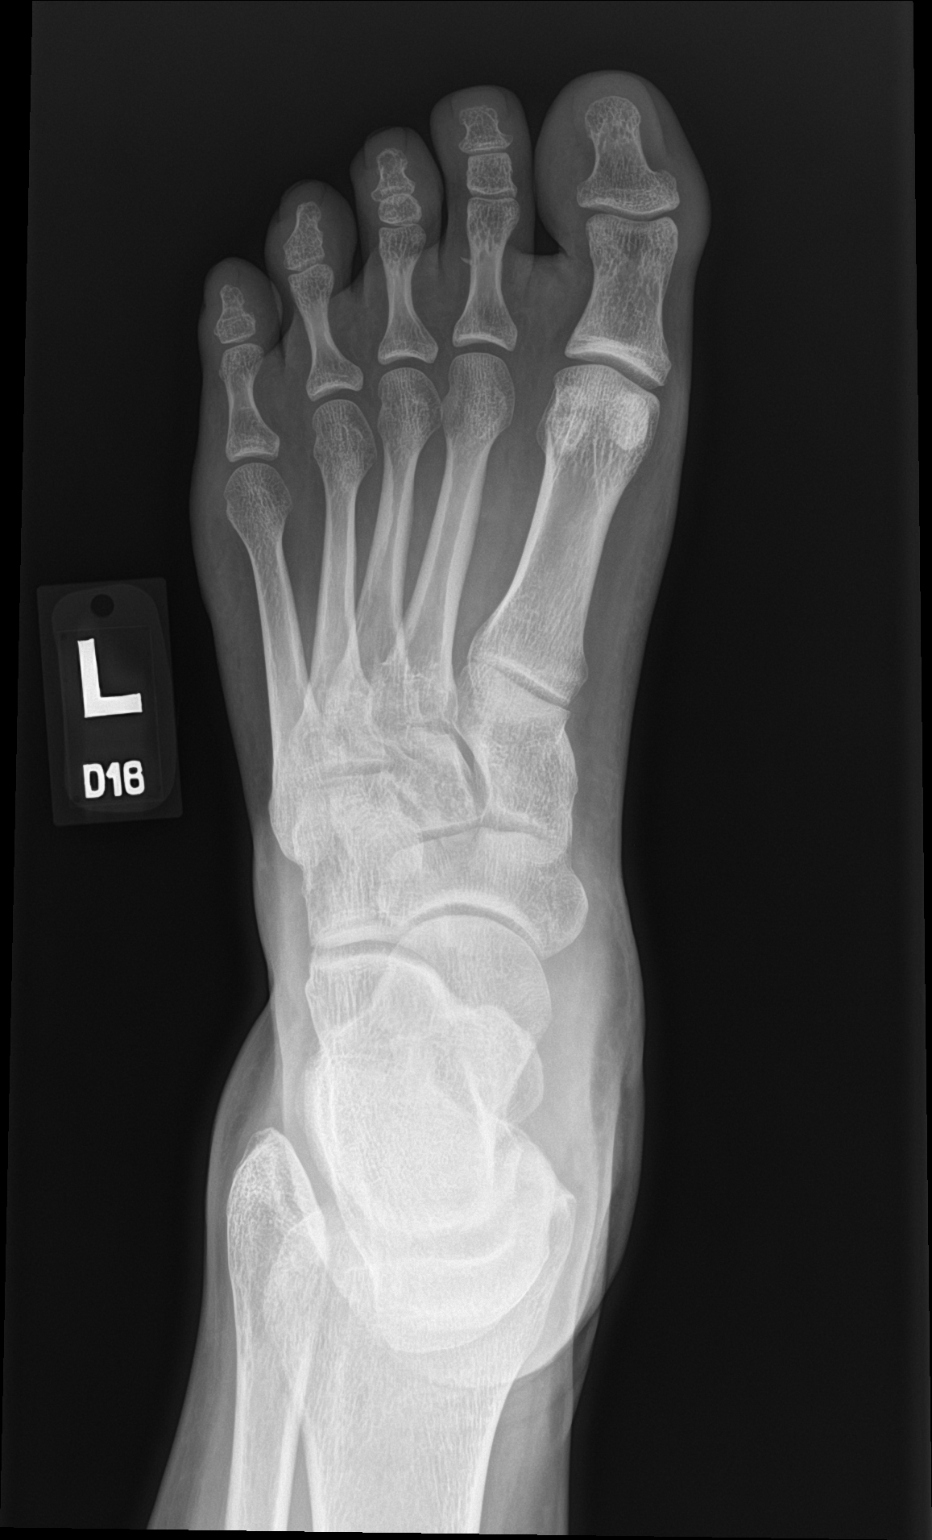

[foot lat]
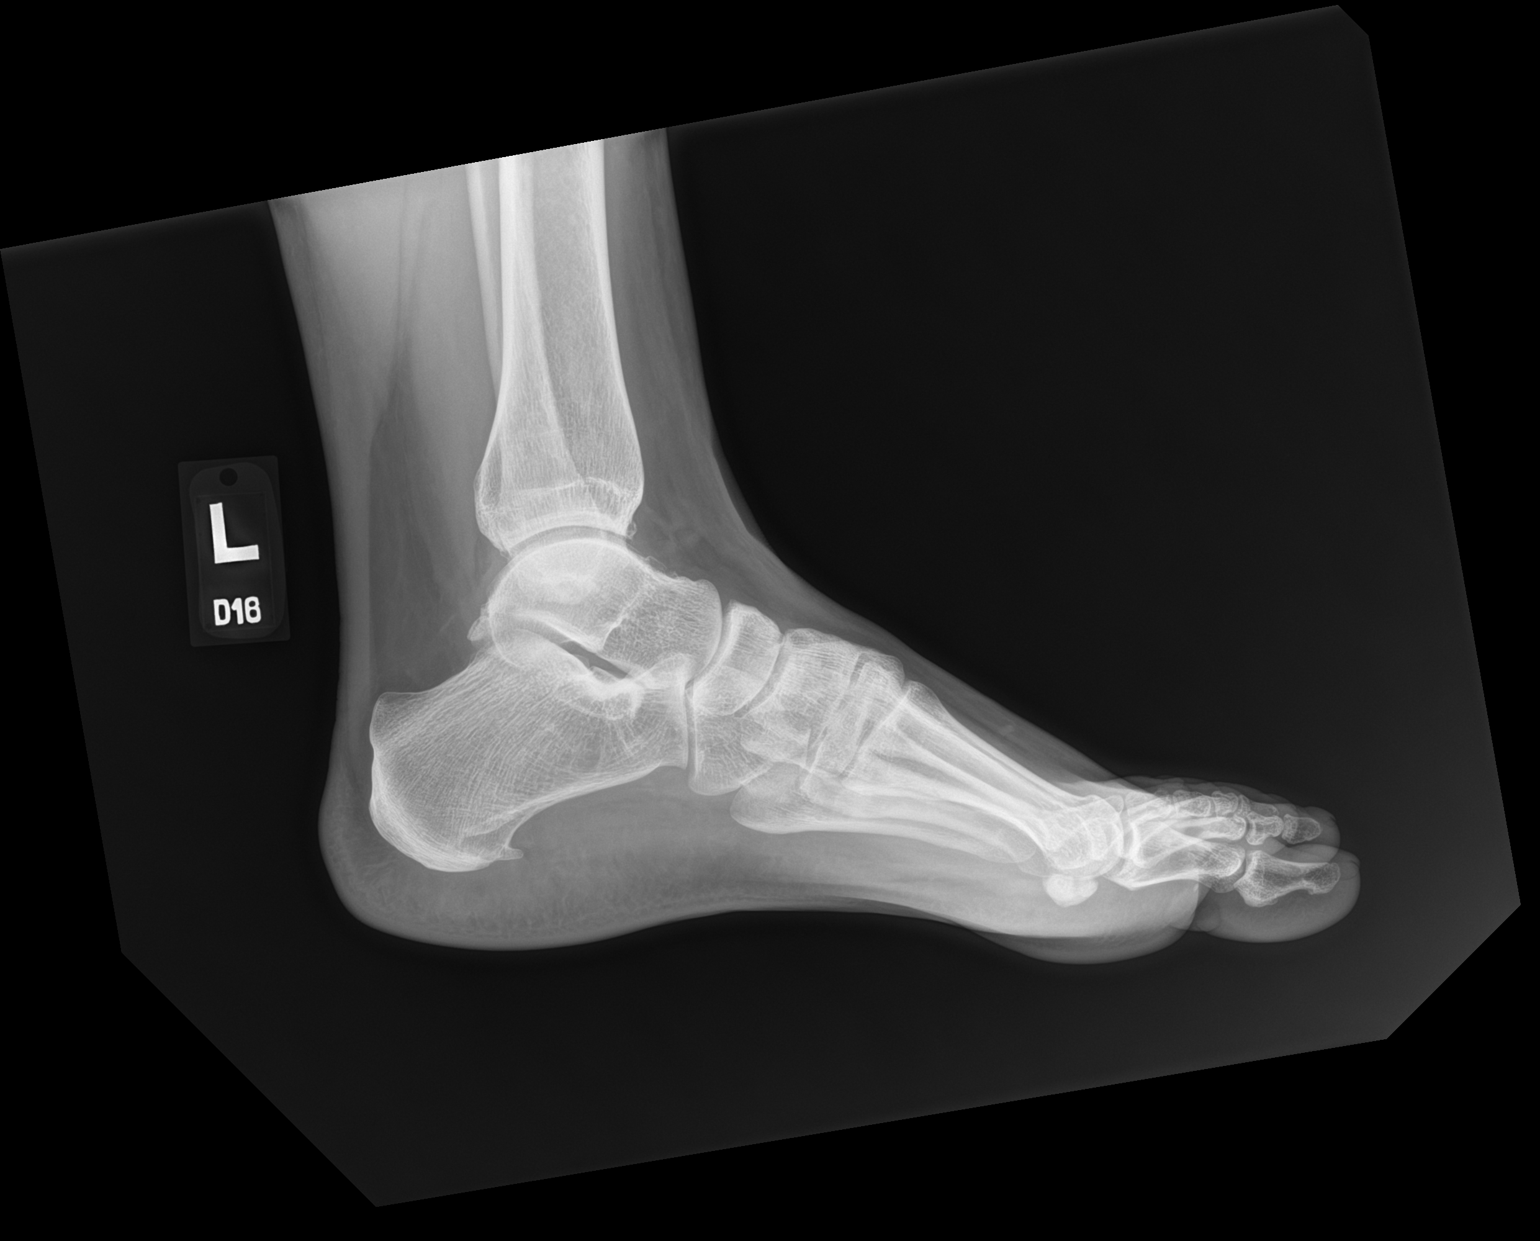

[2 of 2 positions shown; findings below may reference images not displayed]

FINDINGS: The mineralization and alignment are normal. There is no evidence of
acute fracture or dislocation. The joint spaces appear preserved
within the foot. There is a possible small foreign body within the
plantar soft tissues of the 2nd toe. There is a small plantar
calcaneal spur.
IMPRESSION: No acute osseous findings. Possible small foreign body within the
plantar soft tissues of the 2nd toe.

## 2020-12-06 IMAGING — DX DG ANKLE COMPLETE 3+V*L*
3 series · 3 of 3 positions shown · non-contrast
Comparison: None.

CLINICAL DATA: Lateral foot and ankle pain for 1 month. No known
injury.

EXAM:
LEFT ANKLE COMPLETE - 3+ VIEW

[ankle ap]
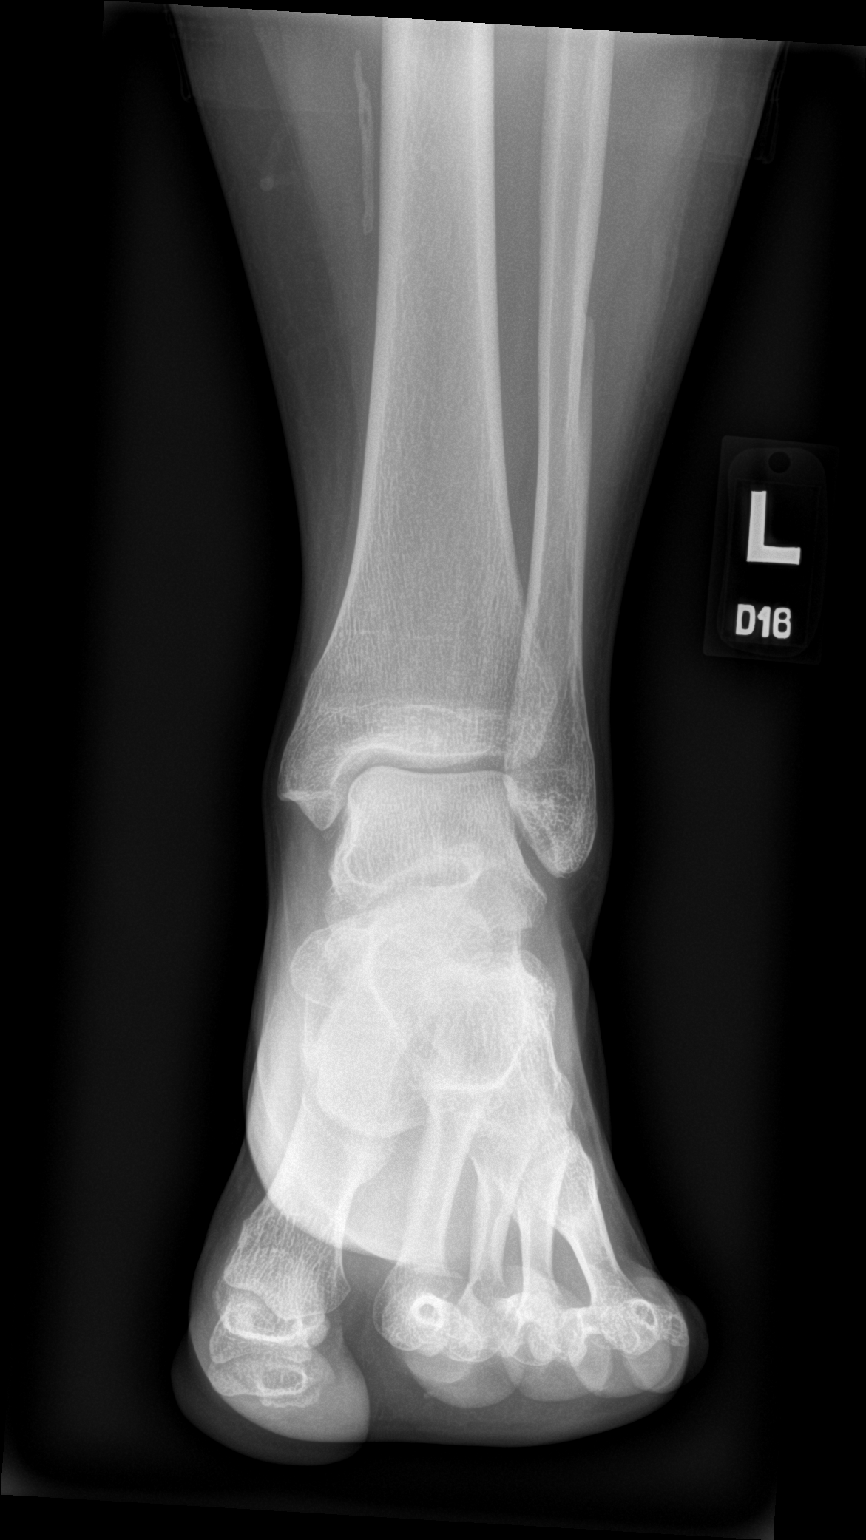

[ankle obl]
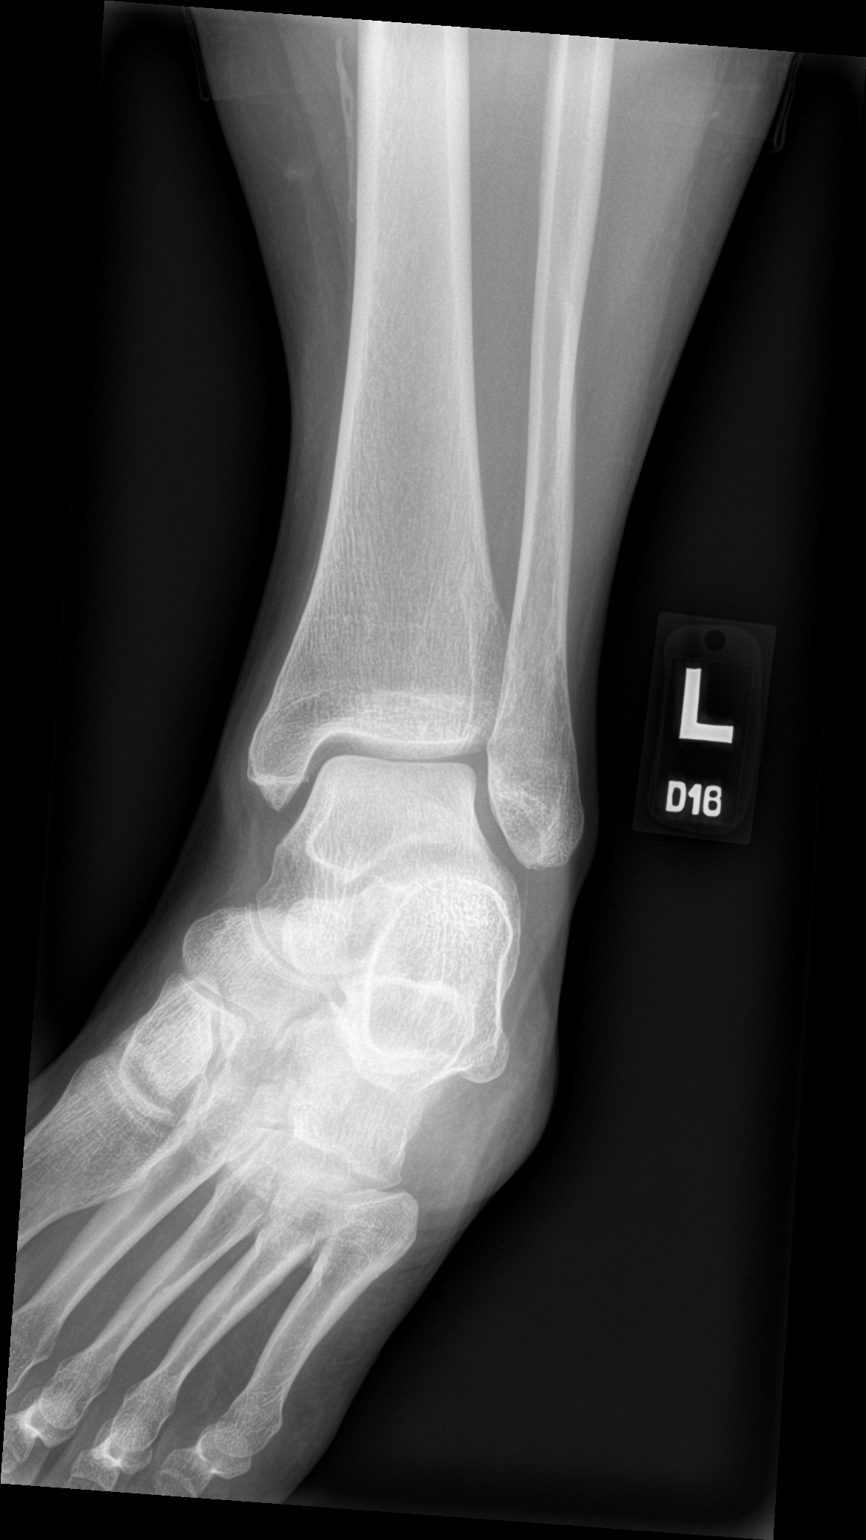

[ankle lat]
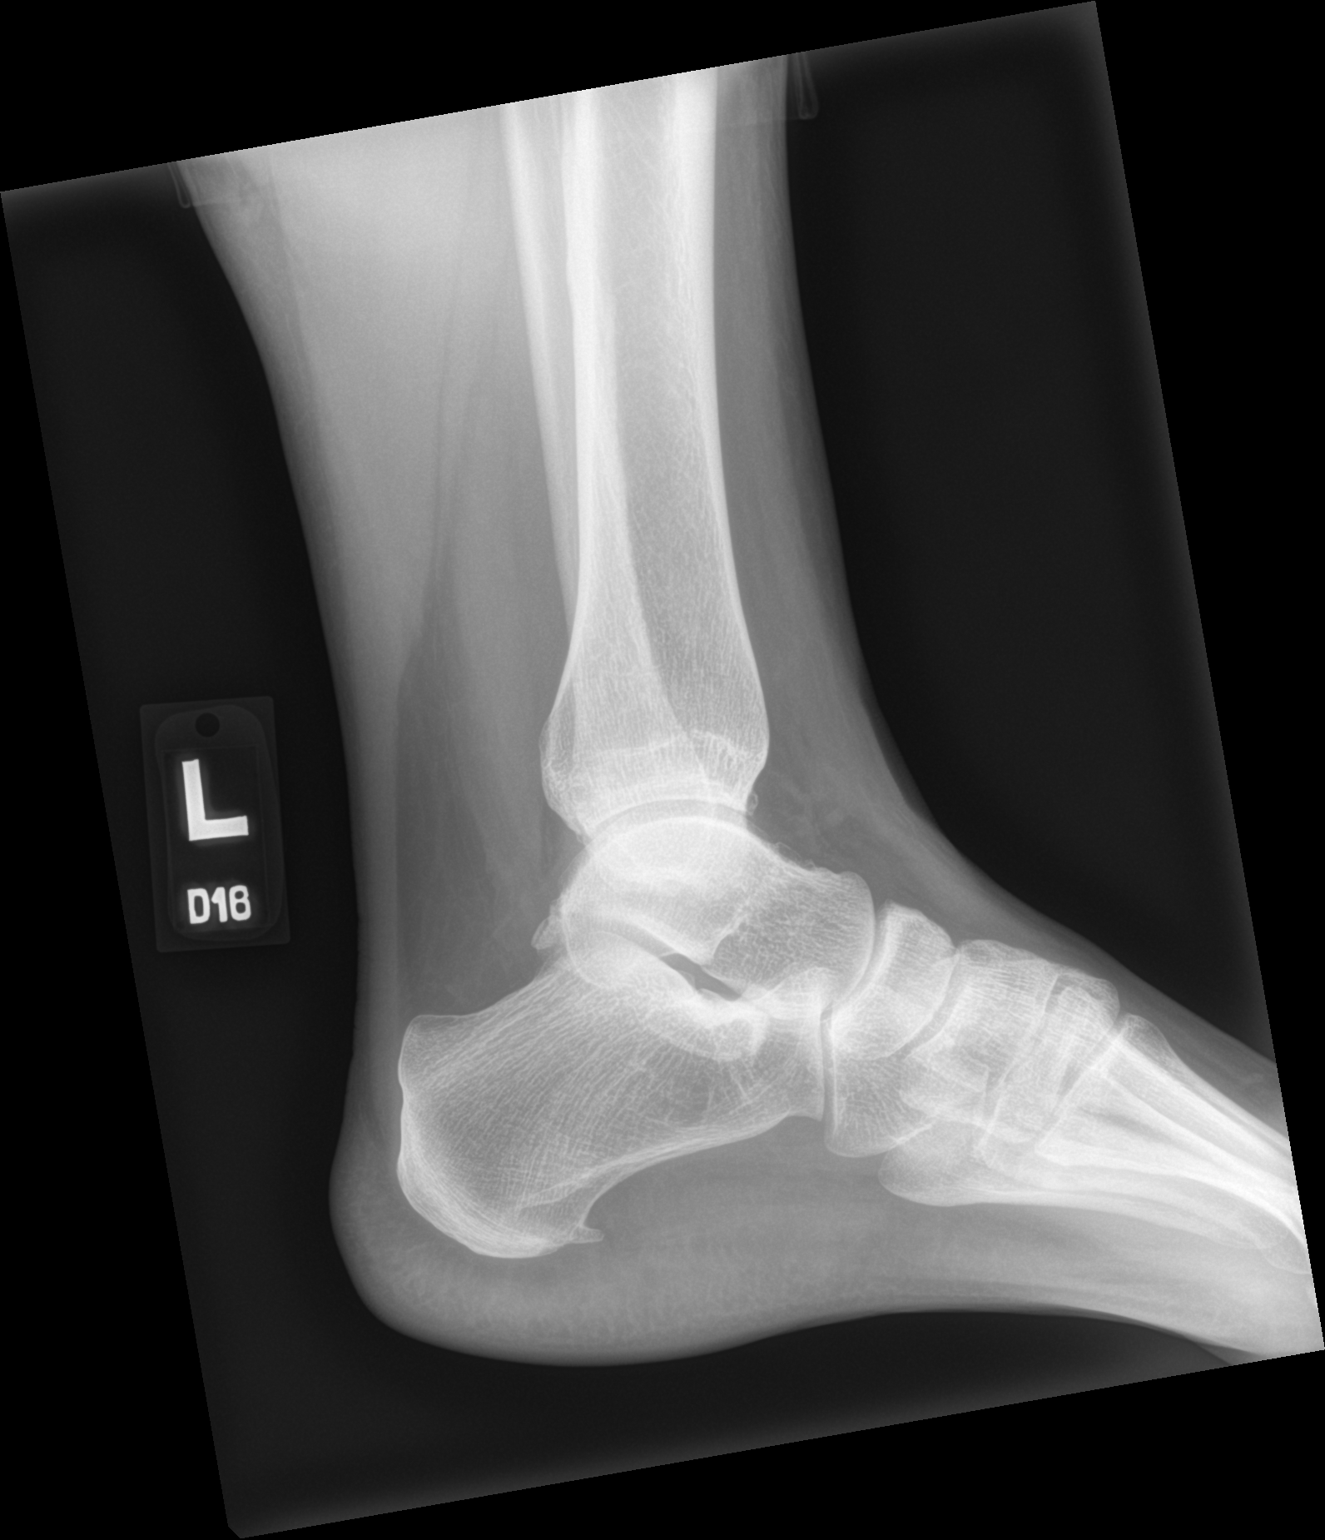

[3 of 3 positions shown; findings below may reference images not displayed]

FINDINGS: The mineralization and alignment are normal. There is no evidence of
acute fracture or dislocation. The joint spaces are preserved. No
focal soft tissue abnormalities are identified at the ankle. As on
the foot radiographs, there is a possible small foreign body within
the plantar soft tissues of the 2nd toe.
IMPRESSION: No acute osseous findings. Possible small foreign body in the
plantar soft tissues of the 2nd toe.

## 2020-12-06 NOTE — Patient Instructions (Addendum)
Good to see you  exercises given  Heel lift in left shoe pennsaid pinkie amount topically 2 times daily as needed.  Ice 20 minutes 2 times daily. Usually after activity and before bed Hoka or Oofos sandals See me again in 4-6 weeks

## 2020-12-06 NOTE — Assessment & Plan Note (Addendum)
Patient does have left ankle pain.  Seems to be on the lateral aspect of the ankle.  More near the heel.  Patient rates the severity of pain is 7 out of 10.  We discussed if worsening pain to seek medical attention immediately.  I believe it is more secondary to a peroneal tendinitis.  Patient can heel lift, topical anti-inflammatories, icing regimen.  We discussed that the low likelihood of anything such as a blood clot.  Patient does not have any risk factors and declined any type of work-up with this being low likelihood.  We discussed x-rays today on the way out as well.  Follow-up again in 4 to 6 weeks.  Worsening pain will consider formal physical therapy.  Addendum.  Patient was walking and had significant amount of pain.  Did have some mild increase in discomfort in the calf we will get a Doppler at this point.

## 2020-12-10 ENCOUNTER — Telehealth: Payer: Self-pay | Admitting: Family Medicine

## 2020-12-10 NOTE — Telephone Encounter (Signed)
Patient called back in response to her xray results. Given MD response. Patient expressed understanding.

## 2021-01-05 NOTE — Progress Notes (Signed)
[name] [address] [place] [address] Phone: [phone] Subjective:   [name], am serving as a scribe for Dr. Antoine Primas. This visit occurred during the SARS-CoV-2 public health emergency.  Safety protocols were in place, including screening questions prior to the visit, additional usage of staff PPE, and extensive cleaning of exam room while observing appropriate contact time as indicated for disinfecting solutions.   I'm seeing this patient by the request  of:  Alan Mulder, MD  CC: ankle pain   [id number]   12/06/2020 Patient does have left ankle pain.  Seems to be on the lateral aspect of the ankle.  More near the heel.  Patient rates the severity of pain is 7 out of 10.  We discussed if worsening pain to seek medical attention immediately.  I believe it is more secondary to a peroneal tendinitis.  Patient can heel lift, topical anti-inflammatories, icing regimen.  We discussed that the low likelihood of anything such as a blood clot.  Patient does not have any risk factors and declined any type of work-up with this being low likelihood.  We discussed x-rays today on the way out as well.  Follow-up again in 4 to 6 weeks.  Worsening pain will consider formal physical therapy.  Addendum.  Patient was walking and had significant amount of pain.  Did have some mild increase in discomfort in the calf we will get a Doppler at this point.  Update 01/06/2021 Rebekah [name] is a 30 y.o. female coming in with complaint of left ankle pain. States that she has reinjured it multiple times since we last saw her. Feels ankle is very unstable. Rolled foot this morning at 3am. Feels better in athletic shoe.   Xray left ankle 12/06/2020 IMPRESSION: No acute osseous findings. Possible small foreign body in the plantar soft tissues of the 2nd toe.  Xray left foot 12/06/2020 IMPRESSION: No acute osseous findings. Possible small foreign body  within the plantar soft tissues of the 2nd toe.    No past medical history on file. Past Surgical History:  Procedure Laterality Date  . WISDOM TOOTH EXTRACTION     Social History   Socioeconomic History  . Marital status: Married    Spouse name: Not on file  . Number of children: Not on file  . Years of education: Not on file  . Highest education level: Not on file  Occupational History  . Not on file  Tobacco Use  . Smoking status: Never Smoker  . Smokeless tobacco: Never Used  Substance and Sexual Activity  . Alcohol use: Yes  . Drug use: Not on file  . Sexual activity: Not on file  Other Topics Concern  . Not on file  Social History Narrative   ** Merged History Encounter **       Social Determinants of Health   Financial Resource Strain: Not on file  Food Insecurity: Not on file  Transportation Needs: Not on file  Physical Activity: Not on file  Stress: Not on file  Social Connections: Not on file   Allergies  Allergen Reactions  . Sumatriptan Succinate Other (See Comments)    Muscle spasms   No family history on file.    Current Outpatient Medications (Respiratory):  .  albuterol (VENTOLIN HFA) 108 (90 Base) MCG/ACT inhaler, INHALE 2 PUFFS BY MOUTH EVERY 6 HOURS ASNEEDED WHEEZING .  budesonide-formoterol (SYMBICORT) 80-4.5 MCG/ACT inhaler, Inhale into the lungs.  Reviewed prior external information including notes and imaging from  primary care provider As well as notes that were available from care everywhere and other healthcare systems.  Past medical history, social, surgical and family history all reviewed in electronic medical record.  No pertanent information unless stated regarding to the chief complaint.   Review of Systems:  No headache, visual changes, nausea, vomiting, diarrhea, constipation, dizziness, abdominal pain, skin rash, fevers, chills, night sweats, weight loss, swollen lymph nodes, body aches, joint swelling, chest pain,  shortness of breath, mood changes. POSITIVE muscle aches  Objective  Blood pressure 106/82, pulse 75, height 5\' 1"  (1.549 m), weight 192 lb (87.1 kg), SpO2 99 %.   General: No apparent distress alert and oriented x3 mood and affect normal, dressed appropriately.  HEENT: Pupils equal, extraocular movements intact  Respiratory: Patient's speak in full sentences and does not appear short of breath  Cardiovascular: No lower extremity edema, non tender, no erythema  Gait normal with good balance and coordination.  MSK: Left ankle exam today does have some instability noted with stressing the ATFL.  Patient is tender to palpation over the ATFL.  No pain over the bone themselves but significantly tender over the plantar aspect of the second toe noted.  Neurovascularly intact distally.  Achilles is unremarkable.  Mild pain on the lateral aspect level of the patella joint.  Limited musculoskeletal ultrasound was performed and interpreted by  Limited ultrasound of patient's right ankle and foot shows that patient's foot does have a small sliver that does seem to be a metallic in the plantar soft tissue.  No signs of any erythema no increase in Doppler flow.  Patient's ATFL does have some degenerative changes noted.  Mild narrowing of the ankle mortise laterally. Impression: Chronic abnormality of the ATFL as well as foreign body in the plantar aspect of the foot.    Impression and Recommendations:     The above documentation has been reviewed and is accurate and complete Judi Saa, DO

## 2021-01-06 ENCOUNTER — Ambulatory Visit (INDEPENDENT_AMBULATORY_CARE_PROVIDER_SITE_OTHER): Payer: BC Managed Care – PPO | Admitting: Family Medicine

## 2021-01-06 ENCOUNTER — Other Ambulatory Visit: Payer: Self-pay

## 2021-01-06 ENCOUNTER — Encounter: Payer: Self-pay | Admitting: Family Medicine

## 2021-01-06 ENCOUNTER — Ambulatory Visit: Payer: Self-pay

## 2021-01-06 VITALS — BP 106/82 | HR 75 | Ht 61.0 in | Wt 192.0 lb

## 2021-01-06 DIAGNOSIS — M25572 Pain in left ankle and joints of left foot: Secondary | ICD-10-CM | POA: Diagnosis not present

## 2021-01-06 DIAGNOSIS — S90852D Superficial foreign body, left foot, subsequent encounter: Secondary | ICD-10-CM | POA: Diagnosis not present

## 2021-01-06 DIAGNOSIS — S90852A Superficial foreign body, left foot, initial encounter: Secondary | ICD-10-CM | POA: Insufficient documentation

## 2021-01-06 NOTE — Patient Instructions (Signed)
MRI foot and ankle We will contact you through MyChart once we have results

## 2021-01-06 NOTE — Assessment & Plan Note (Signed)
Patient on x-ray does have a foreign body in the left foot.  I do believe that this is contributing to some of the ergonomics.  Patient states that she has already had intervention for this previously and was told that it was gone.  Patient is severely tender to this.  This was an incidental finding on x-ray but is severely tender in the area.  No sign of any infectious etiology on inspection today or on ultrasound but I do feel that further evaluation would be warranted.  Follow-up after imaging

## 2021-01-06 NOTE — Assessment & Plan Note (Signed)
Patient continues to have unfortunately instability of the ankle.  Patient does have laxity of the ATFL.  Patient continues to hurt herself she states.  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  Patient has done multiple different things in the past including formal physical therapy and has now been doing home exercises with and not getting any better.  Patient has been doing the heel lift.  I do believe advanced imaging is warranted.  Patient due to the instability no one knee surgery but patient would be open to that if necessary.  Patient will follow up after the imaging and will discuss further.

## 2021-01-19 ENCOUNTER — Telehealth: Payer: Self-pay | Admitting: Family Medicine

## 2021-01-19 NOTE — Telephone Encounter (Signed)
Information has been faxed to [facility] department

## 2021-01-19 NOTE — Telephone Encounter (Signed)
Charisse from [employer] called ( phone [phone]). The ankle MRI is approved but need more info to approve the L foot. They are viewing these as 1 approval.  Need OV notes, Imaging, and any note Dr. Katrinka Blazing would like to add to their Appeals Dept at [phone]. Need this info by Friday.

## 2021-02-08 ENCOUNTER — Ambulatory Visit: Payer: BC Managed Care – PPO

## 2021-02-08 ENCOUNTER — Other Ambulatory Visit: Payer: Self-pay

## 2021-02-24 ENCOUNTER — Ambulatory Visit
Admission: RE | Admit: 2021-02-24 | Discharge: 2021-02-24 | Disposition: A | Discharge status: Home / Self Care | Payer: BC Managed Care – PPO | Source: Ambulatory Visit | Attending: Family Medicine | Admitting: Family Medicine

## 2021-02-24 ENCOUNTER — Other Ambulatory Visit: Payer: Self-pay

## 2021-02-24 DIAGNOSIS — M25572 Pain in left ankle and joints of left foot: Secondary | ICD-10-CM | POA: Insufficient documentation

## 2021-02-24 DIAGNOSIS — S90852D Superficial foreign body, left foot, subsequent encounter: Secondary | ICD-10-CM

## 2021-02-24 IMAGING — MR MR ANKLE*L* W/O CM
6 series · 40 of 40 positions shown · non-contrast
Comparison: Plain films left ankle [DATE].

CLINICAL DATA: Left ankle and foot pain and instability for
approximately 1 year. The patient reports multiple prior twisting
injuries.

EXAM:
MRI OF THE LEFT ANKLE WITHOUT CONTRAST
TECHNIQUE: Multiplanar, multisequence MR imaging of the ankle was performed. No
intravenous contrast was administered.

[Series 3: PD fat-sat · axial · left · 3.0mm · 0.62mm/px · z∈[-79,+61]mm · 8 of 36 slices shown]
[im 1/36]
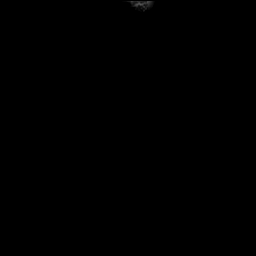
[im 6/36]
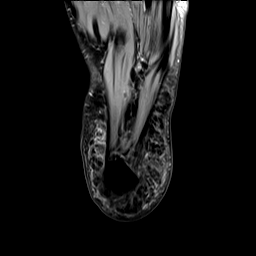
[im 11/36]
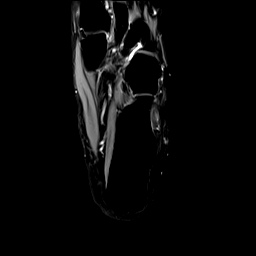
[im 16/36]
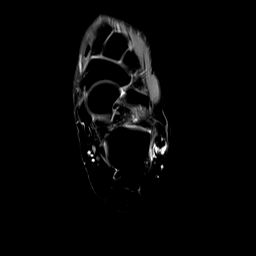
[im 21/36]
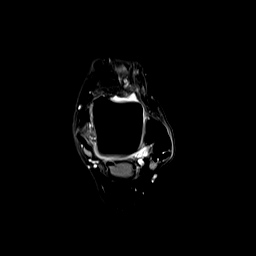
[im 26/36]
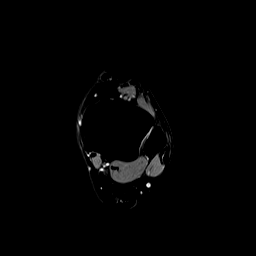
[im 31/36]
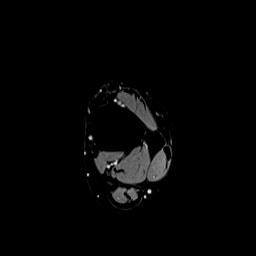
[im 36/36]
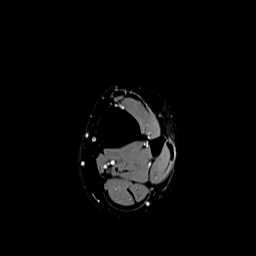

[Series 4: T2 fat-sat · axial · left · 3.0mm · 0.50mm/px · z∈[-79,+61]mm · 8 of 36 slices shown]
[im 1/36]
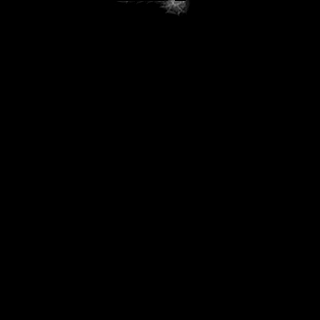
[im 6/36]
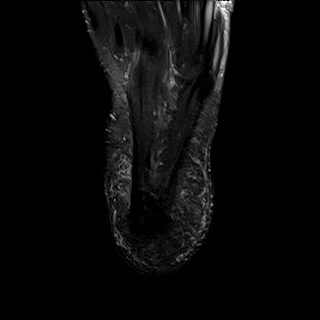
[im 11/36]
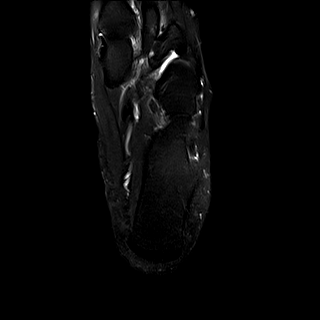
[im 16/36]
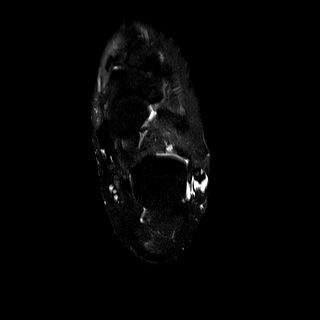
[im 21/36]
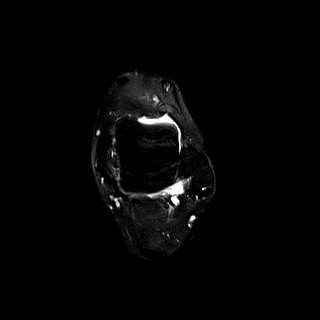
[im 26/36]
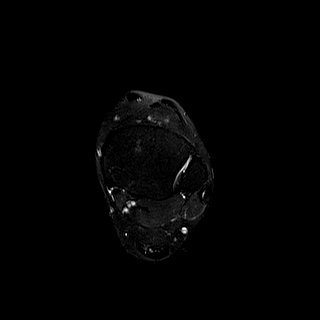
[im 31/36]
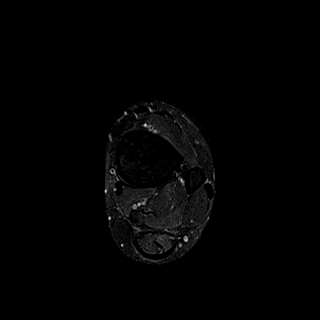
[im 36/36]
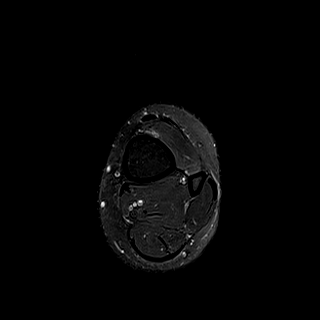

[Series 5: T1 · sagittal · left · 4.0mm · 0.70mm/px · 5 of 21 slices shown]
[im 1/21]
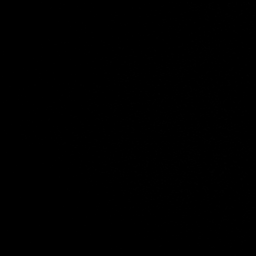
[im 6/21]
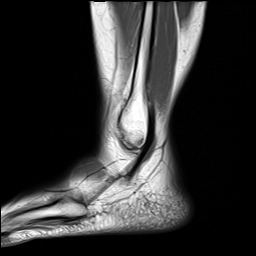
[im 11/21]
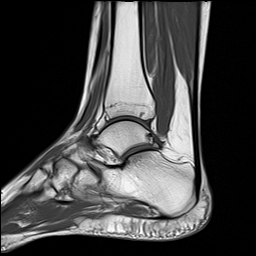
[im 16/21]
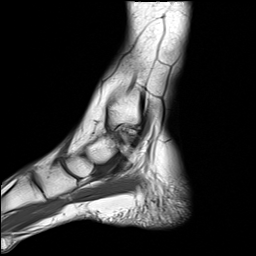
[im 21/21]
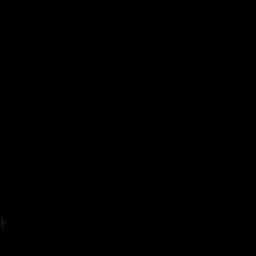

[Series 6: T2 · coronal · left · 3.0mm · 0.62mm/px · 7 of 30 slices shown (1 of 2)]
[im 1/30]
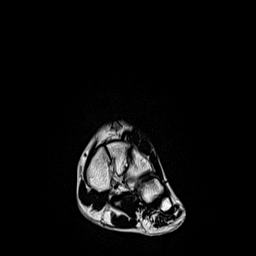
[im 5/30]
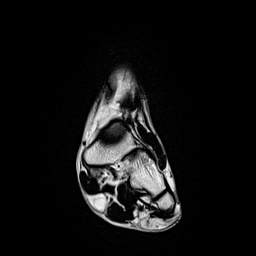
[im 10/30]
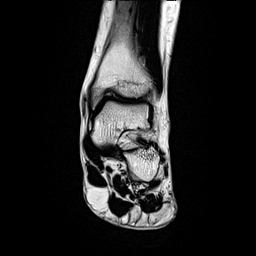
[im 15/30]
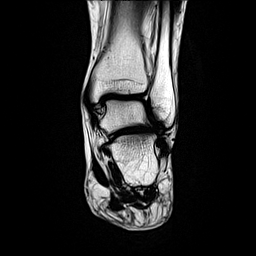
[im 20/30]
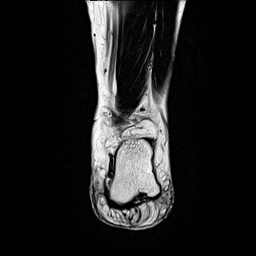
[im 25/30]
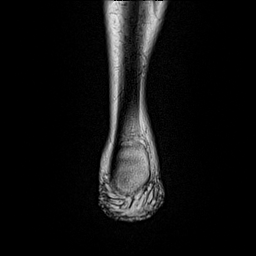
[im 30/30]
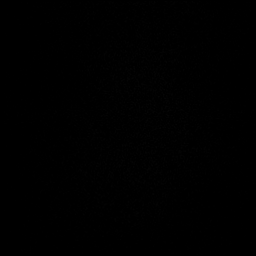

[Series 7: T2 · coronal · left · 3.0mm · 0.62mm/px · 7 of 30 slices shown (2 of 2)]
[im 1/30]
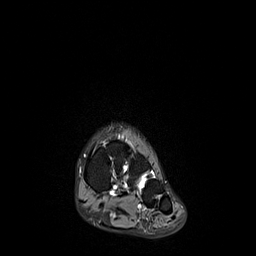
[im 5/30]
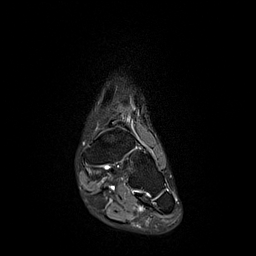
[im 10/30]
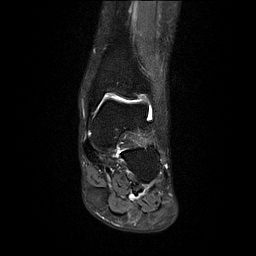
[im 15/30]
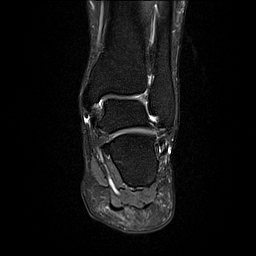
[im 20/30]
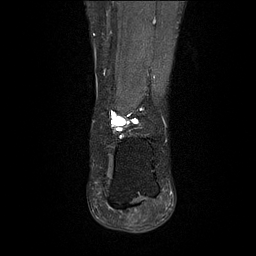
[im 25/30]
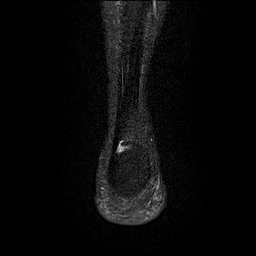
[im 30/30]
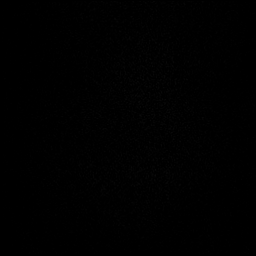

[Series 8: STIR · sagittal · left · 4.0mm · 0.35mm/px · 5 of 21 slices shown]
[im 1/21]
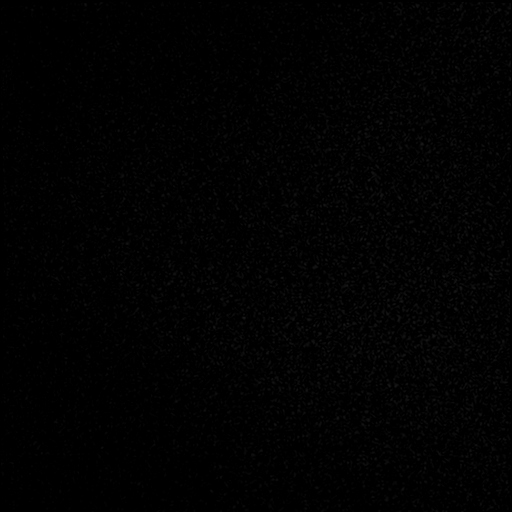
[im 6/21]
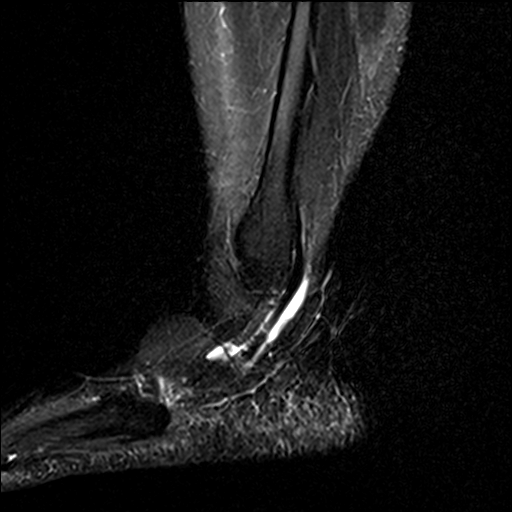
[im 11/21]
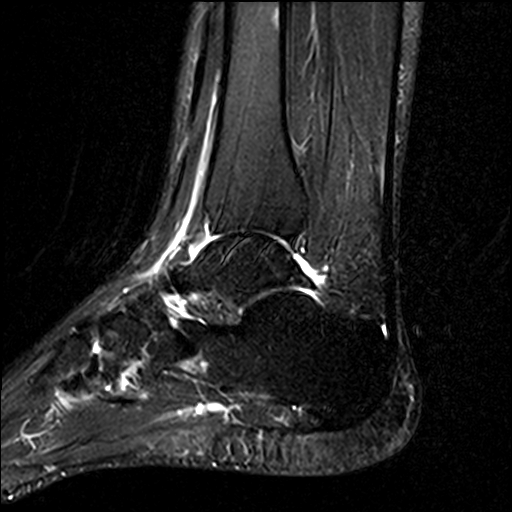
[im 16/21]
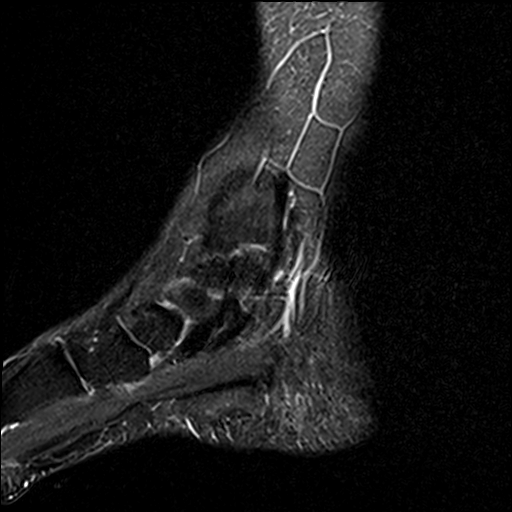
[im 21/21]
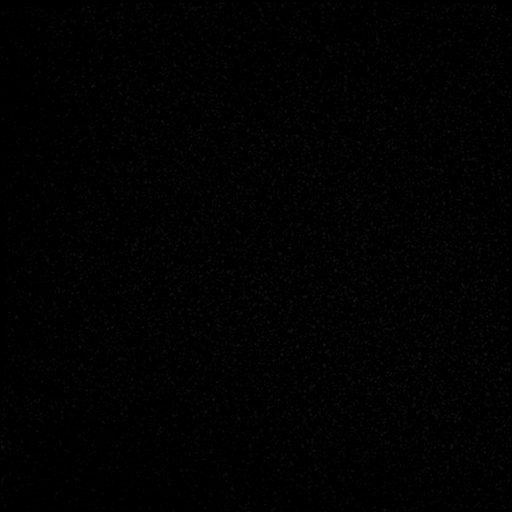

[40 of 40 positions shown; findings below may reference images not displayed]

FINDINGS: TENDONS

Peroneal: Fluid is present in the sheaths of both tendons consistent
with tenosynovitis. Longitudinal split tearing is seen in the
peroneus brevis inferior to the lateral malleolus to approximately
just beyond the peroneal tubercle of the calcaneus. There is also
fraying and irregularity of the peroneus longus just distal to the
peroneal tubercle of the calcaneus.

Posteromedial: Intact.

Anterior: Intact.

Achilles: Intact.

Plantar Fascia: Intact. No evidence of plantar fasciitis. Small
plantar calcaneal spur noted.

LIGAMENTS

Lateral: Intact. The anterior talofibular ligament is attenuated
with mild intrasubstance increased T2 signal consistent with partial
tear/remote sprain.

Medial: Intact.

CARTILAGE

Ankle Joint: Normal. No osteochondral lesion of the talar dome or
joint effusion.

Subtalar Joints/Sinus Tarsi: Normal.

Bones: Normal marrow signal throughout without fracture, stress
change or worrisome lesion.

Other: None.
IMPRESSION: Peroneus longus and brevis tenosynovitis with longitudinal split
tearing of the peroneus brevis and fraying and irregularity of the
peroneus longus as described above.

Partial tear/remote sprain of the anterior talofibular ligament.

## 2021-02-24 IMAGING — MR MR FOOT*L* W/O CM
5 series · 40 of 40 positions shown · non-contrast
Comparison: None.

CLINICAL DATA: Left ankle and foot pain and instability for
approximately 1 year. The patient reports multiple prior twisting
injuries.

EXAM:
MRI OF THE LEFT FOOT WITHOUT CONTRAST
TECHNIQUE: Multiplanar, multisequence MR imaging of the left foot was
performed. No intravenous contrast was administered.

[Series 2: T1 · coronal · left · 3.0mm · 0.47mm/px · 11 of 43 slices shown (1 of 2)]
[im 1/43]
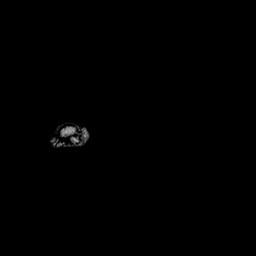
[im 5/43]
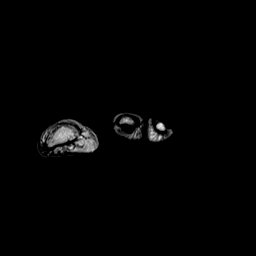
[im 9/43]
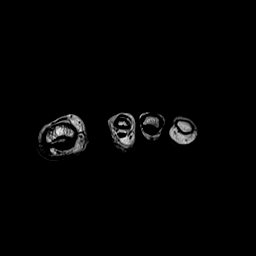
[im 13/43]
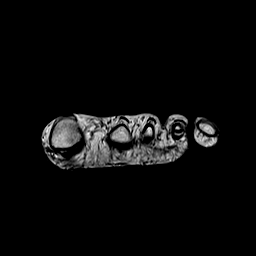
[im 17/43]
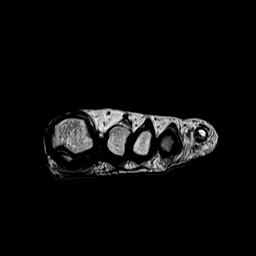
[im 22/43]
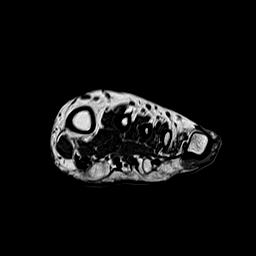
[im 26/43]
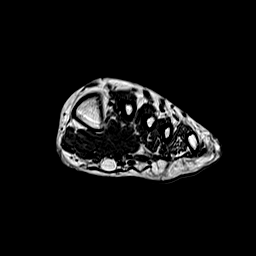
[im 30/43]
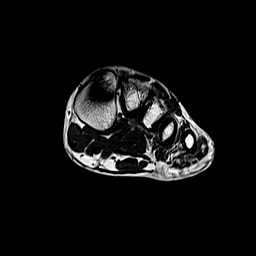
[im 34/43]
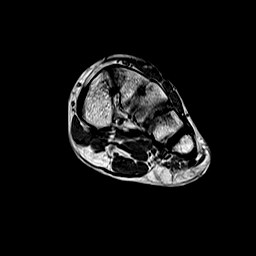
[im 38/43]
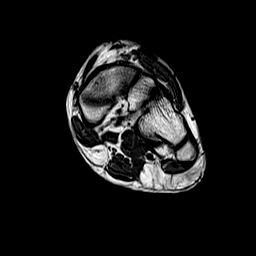
[im 43/43]
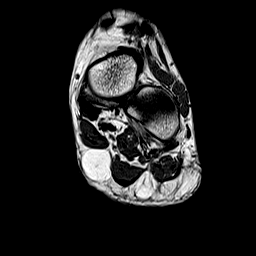

[Series 4: T2 · coronal · left · 3.0mm · 0.47mm/px · 11 of 43 slices shown]
[im 1/43]
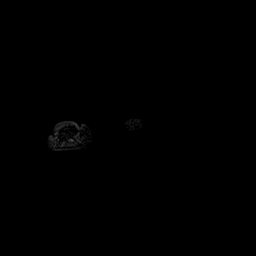
[im 5/43]
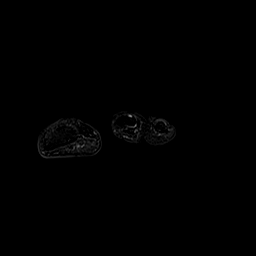
[im 9/43]
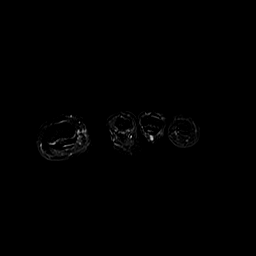
[im 13/43]
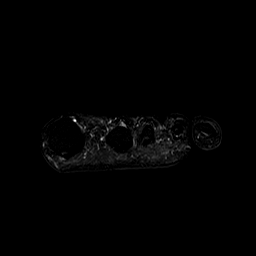
[im 17/43]
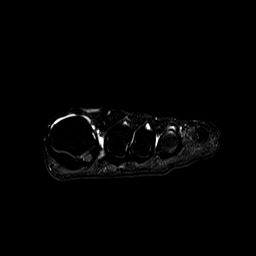
[im 22/43]
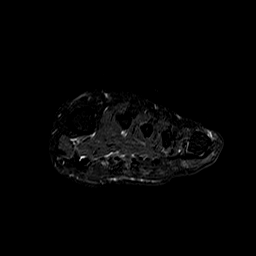
[im 26/43]
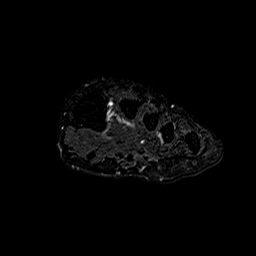
[im 30/43]
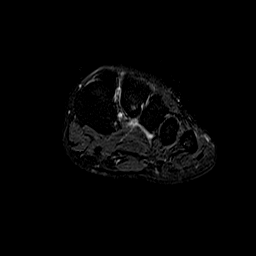
[im 34/43]
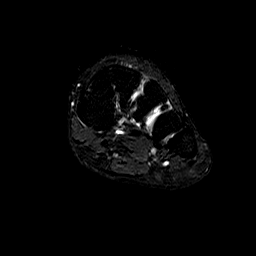
[im 38/43]
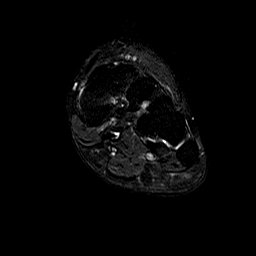
[im 43/43]
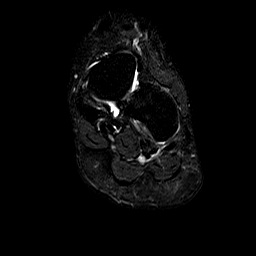

[Series 5: T1 · axial · left · 3.0mm · 0.59mm/px · z∈[-115,-51]mm · 4 of 18 slices shown (2 of 2)]
[im 1/18]
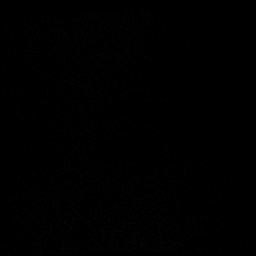
[im 6/18]
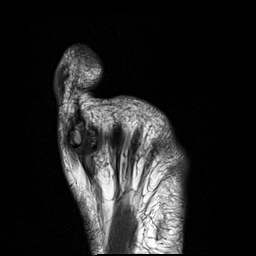
[im 12/18]
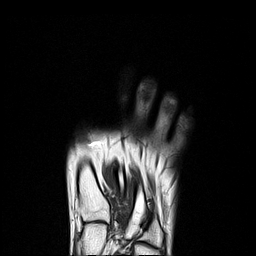
[im 18/18]
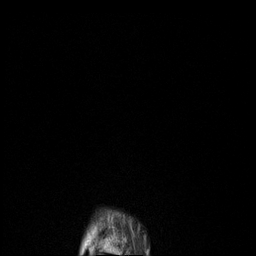

[Series 6: T2 fat-sat · axial · left · 3.0mm · 0.47mm/px · z∈[-115,-51]mm · 4 of 18 slices shown]
[im 1/18]
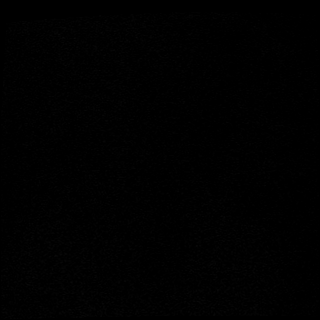
[im 6/18]
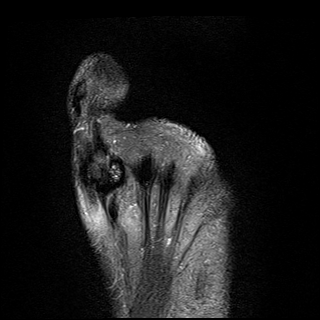
[im 12/18]
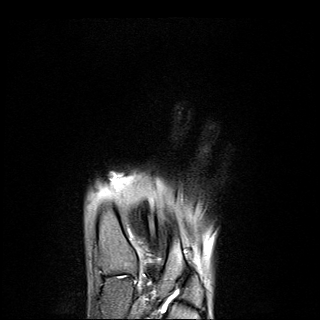
[im 18/18]
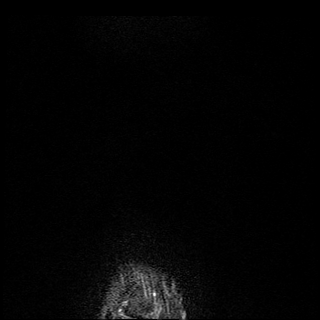

[Series 7: PD fat-sat · sagittal · left · 2.0mm · 0.55mm/px · 10 of 43 slices shown]
[im 1/43]
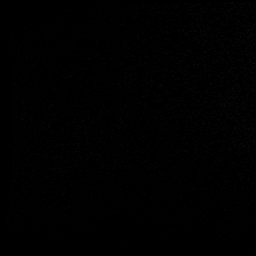
[im 5/43]
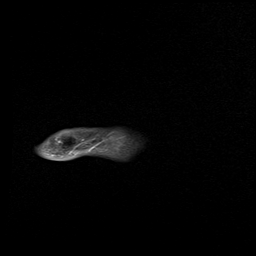
[im 10/43]
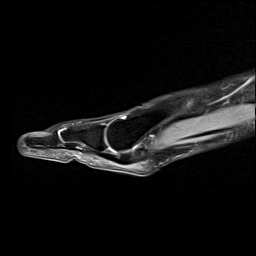
[im 15/43]
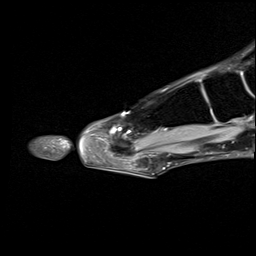
[im 19/43]
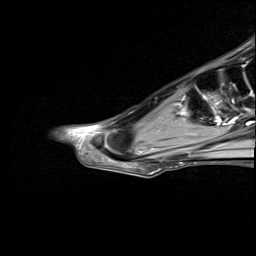
[im 24/43]
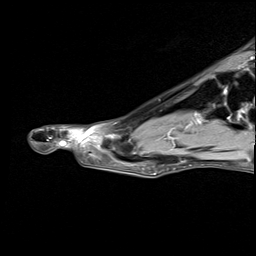
[im 29/43]
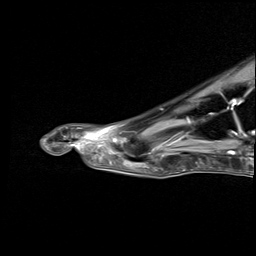
[im 33/43]
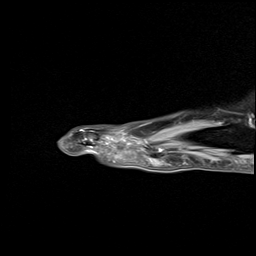
[im 38/43]
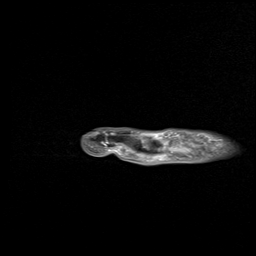
[im 43/43]
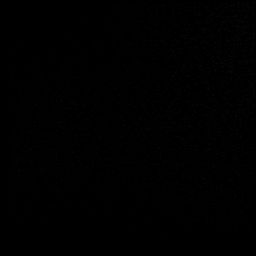

[40 of 40 positions shown; findings below may reference images not displayed]

FINDINGS: Bones/Joint/Cartilage

Marrow signal is normal without fracture, stress change or focal
lesion. No evidence of arthropathy. No joint effusion.

Ligaments

Intact and normal in appearance.

Muscles and Tendons

Intact and normal in appearance.

Soft tissues

Normal.
IMPRESSION: Normal MRI left foot.

## 2021-03-29 ENCOUNTER — Telehealth: Payer: Self-pay | Admitting: Family Medicine

## 2021-03-29 ENCOUNTER — Other Ambulatory Visit: Payer: Self-pay

## 2021-03-29 DIAGNOSIS — M25572 Pain in left ankle and joints of left foot: Secondary | ICD-10-CM

## 2021-03-29 NOTE — Telephone Encounter (Signed)
Patient called back following up her MRI results.  She would like to start with the brace, heel lift, and PT but she asked for some more information on what sort of brace she would be needing, etc.

## 2021-03-29 NOTE — Telephone Encounter (Signed)
Patient would like to try AirCast brace and heel lift. Will come in on Thursday at 10:00am to be fitted.

## 2021-03-29 NOTE — Telephone Encounter (Signed)
Physical therapy order submitted. Patient notified.

## 2021-04-01 ENCOUNTER — Telehealth: Payer: Self-pay

## 2021-04-01 NOTE — Telephone Encounter (Signed)
Patient states she was in yesterday and received a brace for Ankle/foot and it has seemed to have broken. Patient wants to know if she can bring that one in and get a replacement.

## 2021-04-04 NOTE — Telephone Encounter (Signed)
Patient called back this morning. She asked if there was a way that we could mail her a new brace?

## 2021-04-05 NOTE — Telephone Encounter (Signed)
Patient will come in today to get new brace.

## 2021-04-14 ENCOUNTER — Other Ambulatory Visit: Payer: Self-pay

## 2021-04-14 ENCOUNTER — Ambulatory Visit: Payer: BC Managed Care – PPO | Attending: Family Medicine

## 2021-04-14 DIAGNOSIS — M79662 Pain in left lower leg: Secondary | ICD-10-CM | POA: Diagnosis present

## 2021-04-14 DIAGNOSIS — R262 Difficulty in walking, not elsewhere classified: Secondary | ICD-10-CM | POA: Diagnosis present

## 2021-04-14 DIAGNOSIS — M25572 Pain in left ankle and joints of left foot: Secondary | ICD-10-CM | POA: Diagnosis present

## 2021-04-14 NOTE — Patient Instructions (Addendum)
  Access Code: [email or web address] URL: [email or web address] Date: 04/14/2021 Prepared by: Loralyn Freshwater  Exercises LE neural flossing - 1 x daily - 7 x weekly - 3 sets - 10 reps   Magazine: circle every L foot.

## 2021-04-14 NOTE — Therapy (Signed)
[facility] [address] [place], [address] Phone: [phone]   Fax:  [phone]  Physical Therapy Evaluation  Patient Details  Name: Rebekah [name] MRN: [id number] Date of Birth: 1991-04-03 Referring Provider (PT): Judi Saa, DO  Encounter Date: 04/14/2021   PT End of Session - 04/14/21 0847     Visit Number 1    Number of Visits 17    Date for PT Re-Evaluation 06/09/21    Authorization Type 1    Authorization Time Period 10    PT Start Time 0847    PT Stop Time 0933    PT Time Calculation (min) 46 min    Activity Tolerance Patient tolerated treatment well    Behavior During Therapy [facility] for tasks assessed/performed             No past medical history on file.  Past Surgical History:  Procedure Laterality Date   WISDOM TOOTH EXTRACTION      There were no vitals filed for this visit.    Subjective Assessment - 04/14/21 0849     Subjective L foot and ankle: 9/10 currently (pt sitting), 10/10 at most for the past 3 months (would wake her up from deep sleep).    Pertinent History L lateral ankle pain. Pt rolled her L ankle all throughout elementary and high school. Pain has worsened when she got pregnant with her daughter about 19 months ago. L ankle keeps rolling even when she was just standing.  Feels radiating pain when her comforter lays on her L foot. Currently wearing an air cast last week which helps her from rolling her L ankle. L ankle currently throbs. Pain is located L pinky toe and L peroneal tendons and muscles. The top of her L foot is currently tender.    Patient Stated Goals Decrease pain. Be able to play soccer with her kids. Be able to get out of bed and walk and not roll her ankle all the time.    Currently in Pain? Yes    Pain Score 9     Pain Location Ankle    Pain Orientation Left;Lateral    Pain Descriptors / Indicators Throbbing;Sharp;Pins and needles     Pain Type Chronic pain    Pain Onset More than a month ago    Pain Frequency Constant    Aggravating Factors  Movement. Pressure from comforter. Walking (had to stop walking at the pavement), 5 miles of stationary bike (unable to feel her L 5th toe after 5 miles of stationary bike)    Pain Relieving Factors rest, ice                [facility] PT Assessment - 04/14/21 0900       Assessment   Medical Diagnosis M25.572 (ICD-10-CM) - Left lateral ankle pain    Referring Provider (PT) Judi Saa, DO    Onset Date/Surgical Date 03/29/21   Date PT referral signed. Chronic condition.     Restrictions   Other Position/Activity Restrictions No known restrcitions      Observation/Other Assessments   Focus on Therapeutic Outcomes (FOTO)  L ankle FOTO 21      Posture/Postural Control   Posture Comments B protracted shouldrs, slight L ankle IV      AROM   Left Ankle Dorsiflexion -18   -15 with lateral leg tightness.   Left Ankle Plantar Flexion 46    Left Ankle Inversion 16  with pain   Left Ankle Eversion 5   with pain     Strength   Right Hip Flexion 4/5    Right Hip Extension 4-/5    Right Hip ABduction 4+/5    Left Hip Flexion 4-/5    Left Hip Extension 3+/5    Left Hip ABduction 4/5    Right Knee Flexion 4/5    Right Knee Extension 5/5    Left Knee Flexion 4-/5    Left Knee Extension 4/5   with leg pain from hand placement   Left Ankle Dorsiflexion 3+/5    Left Ankle Plantar Flexion 4/5    Left Ankle Inversion 3-/5    Left Ankle Eversion 3-/5      Palpation   Palpation comment TTP L lateral leg and dorsal lateral ankle.      Ambulation/Gait   Gait Comments with L ankle Air Cast brace: L ankle IV during stance phase. Decreased L LE stance/antalgic pattern                        Objective measurements completed on examination: See above findings.   MRI: torn peroneal brevis tendon and ATFL     No latex allergies  MD said to try PT first.    (_+) SLUMP L LE with symptoms  Medbridge Access Code [email or web address]   Therapeutic exercise L LE neural tension 10x2  For home: Pt was recommended to circle all the L ankles in a magazine, then turn it in clockwise direction and perform again for neuroplascity for pain. Pt demonstrated and verbalized understanding.    Improved exercise technique, movement at target joints, use of target muscles after mod verbal, visual, tactile cues.  Response to treatment Pt tolerated session well without aggravation of symptoms.    Clinical impression Pt is a 30 year old female who came to physical therapy secondary to chronic L ankle pain.  She also presents with altered gait pattern and posture, increased L leg, ankle, and foot sensitivity/TTP, L hip and ankle weakness, limited L ankle AROM, and difficulty with weight bearing activities such as gait secondary to ankle pain and instability. Pt will benefit from skilled physical therapy services to address the aforementioned deficits.                 PT Education - 04/14/21 1250     Education Details ther-ex, HEP, plan of care    Person(s) Educated Patient    Methods Explanation;Demonstration;Tactile cues;Verbal cues;Handout    Comprehension Verbalized understanding;Returned demonstration              PT Short Term Goals - 04/14/21 1253       PT SHORT TERM GOAL #1   Title Pt will be independent with her initial HEP to decrease pain, improve strength and ability to ambulate more comfortably.    Baseline Pt has started her HEP (04/14/2021)    Time 3    Period Weeks    Status New    Target Date 05/05/21               PT Long Term Goals - 04/14/21 1256       PT LONG TERM GOAL #1   Title Pt will have a decrease in L ankle pain to 3/10 or less at most to promote ability to ambualte as well as perform standing tasks more comfortably.    Baseline 10/10 L ankle pain at most for the past 3 months (  04/14/2021)    Time 8     Period Weeks    Status New    Target Date 06/09/21      PT LONG TERM GOAL #2   Title Pt will improve L ankle strength by at least 1/2 MMT grade all planes to promote ankle stability and ability to ambulate more comfortably.    Baseline L ankle DF 3+/5, PF 4/5, IV 3-/5, EV 3-/5 (04/14/2021)    Time 8    Period Weeks    Status New    Target Date 06/09/21      PT LONG TERM GOAL #3   Title Pt will improve her FOTO score by at least 10 points as a demonstration of improved function.    Baseline L ankle FOTO 21 (04/14/2021)    Time 8    Period Weeks    Status New    Target Date 06/09/21                    Plan - 04/14/21 1250     Clinical Impression Statement Pt is a 30 year old female who came to physical therapy secondary to chronic L ankle pain.  She also presents with altered gait pattern and posture, increased L leg, ankle, and foot sensitivity/TTP, L hip and ankle weakness, limited L ankle AROM, and difficulty with weight bearing activities such as gait secondary to ankle pain and instability. Pt will benefit from skilled physical therapy services to address the aforementioned deficits.    Personal Factors and Comorbidities Fitness;Time since onset of injury/illness/exacerbation;Past/Current Experience    Examination-Activity Limitations Locomotion Level    Stability/Clinical Decision Making Stable/Uncomplicated    Clinical Decision Making Low    Rehab Potential Fair    PT Frequency 2x / week    PT Duration 8 weeks    PT Treatment/Interventions Neuromuscular re-education;Therapeutic activities;Therapeutic exercise;Balance training;Patient/family education;Manual techniques;Dry needling;Aquatic Therapy;Electrical Stimulation;Iontophoresis 4mg /ml Dexamethasone    PT Next Visit Plan pain control, AROM, stability/strengthening, manual techniques, modalities PRN    PT Home Exercise Plan Medbridge Access Code [email or web address]    Consulted and Agree with Plan of Care Patient              Patient will benefit from skilled therapeutic intervention in order to improve the following deficits and impairments:  Pain, Postural dysfunction, Improper body mechanics, Difficulty walking, Decreased strength  Visit Diagnosis: Pain in left ankle and joints of left foot - Plan: PT plan of care cert/re-cert  Difficulty in walking, not elsewhere classified - Plan: PT plan of care cert/re-cert  Pain in left lower leg - Plan: PT plan of care cert/re-cert     Problem List Patient Active Problem List   Diagnosis Date Noted   Foreign body in left foot 01/06/2021   Left ankle pain 12/06/2020   Left lower quadrant abdominal pain of unknown etiology 12/24/2017   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, [place] Phone: [phone]   Fax:  [phone]  Name: Rebekah [name] MRN: Rebekah [name] Date of Birth: 09-Jan-1991

## 2021-04-19 ENCOUNTER — Ambulatory Visit: Payer: BC Managed Care – PPO

## 2021-04-19 DIAGNOSIS — M25572 Pain in left ankle and joints of left foot: Secondary | ICD-10-CM

## 2021-04-19 DIAGNOSIS — M79662 Pain in left lower leg: Secondary | ICD-10-CM

## 2021-04-19 DIAGNOSIS — R262 Difficulty in walking, not elsewhere classified: Secondary | ICD-10-CM

## 2021-04-19 NOTE — Therapy (Signed)
[place] [facility] [address], [place], [address] Phone: [phone]   Fax:  [phone]  Physical Therapy Treatment  Patient Details  Name: Rebekah [name] MRN: [id number] Date of Birth: 16-Feb-1991 Referring Provider (PT): Judi Saa, DO   Encounter Date: 04/19/2021   PT End of Session - 04/19/21 0805     Visit Number 2    Number of Visits 17    Date for PT Re-Evaluation 06/09/21    Authorization Type 2    Authorization Time Period 10    PT Start Time 0805    PT Stop Time 0847    PT Time Calculation (min) 42 min    Activity Tolerance Patient tolerated treatment well    Behavior During Therapy [facility] for tasks assessed/performed             No past medical history on file.  Past Surgical History:  Procedure Laterality Date   WISDOM TOOTH EXTRACTION      There were no vitals filed for this visit.     Subjective Assessment - 04/19/21 0806     Subjective L ankle is about 7/10 currently. Did the magazine HEP.    Pertinent History L lateral ankle pain. Pt rolled her L ankle all throughout elementary and high school. Pain has worsened when she got pregnant with her daughter about 19 months ago. L ankle keeps rolling even when she was just standing.  Feels radiating pain when her comforter lays on her L foot. Currently wearing an air cast last week which helps her from rolling her L ankle. L ankle currently throbs. Pain is located L pinky toe and L peroneal tendons and muscles. The top of her L foot is currently tender.    Patient Stated Goals Decrease pain. Be able to play soccer with her kids. Be able to get out of bed and walk and not roll her ankle all the time.    Currently in Pain? Yes    Pain Score 7     Pain Onset More than a month ago                                       PT Education - 04/19/21 1200     Education Details ther-ex    Person(s)  Educated Patient    Methods Explanation;Demonstration;Tactile cues;Verbal cues    Comprehension Returned demonstration;Verbalized understanding            Objective     MRI: torn peroneal brevis tendon and ATFL         No latex allergies   MD said to try PT first.   (+) SLUMP L LE with symptoms   Medbridge Access Code [email or web address]     Manual therapy Prone STM L lateral leg and ankle area with edge tool to decrease scar tissue/fascial restrictions. Performed along the fibularis muscles and tendons.   Prone STM L lateral leg along tib anterior to decrease fascial restrictions.   Feels good per pt.    Therapeutic exercise  L LE neural floss 30x   Supine quad set (pillow behind knee) 10x5 seconds for 3 sets to promote knee extension  prone ankle isometrics, PT manual resistance   Gentle   EV 10x3 with 5 second holds   DF 10x3 with 5 second holds   Improved exercise technique, movement at target  joints, use of target muscles after mod verbal, visual, tactile cues.   Response to treatment Pt tolerated session well without aggravation of symptoms.     Clinical impression  Worked on STM to decrease scar tissue/soft tissue adhesions followed by gentle isometrics to promote proper loading to affected tissues to promote healing. Decreased L ankle and leg pain to 5-6/10 after session. Feels more loose per pt after session, feels better/nice. Pt will benefit from continued skilled physical therapy services to decrease pain, improve strength and function.         PT Short Term Goals - 04/14/21 1253       PT SHORT TERM GOAL #1   Title Pt will be independent with her initial HEP to decrease pain, improve strength and ability to ambulate more comfortably.    Baseline Pt has started her HEP (04/14/2021)    Time 3    Period Weeks    Status New    Target Date 05/05/21               PT Long Term Goals - 04/14/21 1256       PT LONG TERM GOAL #1   Title Pt will  have a decrease in L ankle pain to 3/10 or less at most to promote ability to ambualte as well as perform standing tasks more comfortably.    Baseline 10/10 L ankle pain at most for the past 3 months (04/14/2021)    Time 8    Period Weeks    Status New    Target Date 06/09/21      PT LONG TERM GOAL #2   Title Pt will improve L ankle strength by at least 1/2 MMT grade all planes to promote ankle stability and ability to ambulate more comfortably.    Baseline L ankle DF 3+/5, PF 4/5, IV 3-/5, EV 3-/5 (04/14/2021)    Time 8    Period Weeks    Status New    Target Date 06/09/21      PT LONG TERM GOAL #3   Title Pt will improve her FOTO score by at least 10 points as a demonstration of improved function.    Baseline L ankle FOTO 21 (04/14/2021)    Time 8    Period Weeks    Status New    Target Date 06/09/21                   Plan - 04/19/21 1203     Clinical Impression Statement Worked on STM to decrease scar tissue/soft tissue adhesions followed by gentle isometrics to promote proper loading to affected tissues to promote healing. Decreased L ankle and leg pain to 5-6/10 after session. Feels more loose per pt after session, feels better/nice. Pt will benefit from continued skilled physical therapy services to decrease pain, improve strength and function.    Personal Factors and Comorbidities Fitness;Time since onset of injury/illness/exacerbation;Past/Current Experience    Examination-Activity Limitations Locomotion Level    Stability/Clinical Decision Making Stable/Uncomplicated    Rehab Potential Fair    PT Frequency 2x / week    PT Duration 8 weeks    PT Treatment/Interventions Neuromuscular re-education;Therapeutic activities;Therapeutic exercise;Balance training;Patient/family education;Manual techniques;Dry needling;Aquatic Therapy;Electrical Stimulation;Iontophoresis 4mg /ml Dexamethasone    PT Next Visit Plan pain control, AROM, stability/strengthening, manual techniques,  modalities PRN    PT Home Exercise Plan Medbridge Access Code [email or web address]    Consulted and Agree with Plan of Care Patient  Patient will benefit from skilled therapeutic intervention in order to improve the following deficits and impairments:  Pain, Postural dysfunction, Improper body mechanics, Difficulty walking, Decreased strength  Visit Diagnosis: Pain in left ankle and joints of left foot  Difficulty in walking, not elsewhere classified  Pain in left lower leg     Problem List Patient Active Problem List   Diagnosis Date Noted   Foreign body in left foot 01/06/2021   Left ankle pain 12/06/2020   Left lower quadrant abdominal pain of unknown etiology 12/24/2017   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 [place], [address] Phone: [phone]   Fax:  [phone]  Name: Laura-Lee Villegas MRN: [id number] Date of Birth: May 24, 1991

## 2021-04-25 ENCOUNTER — Other Ambulatory Visit: Payer: Self-pay

## 2021-04-25 ENCOUNTER — Ambulatory Visit: Payer: BC Managed Care – PPO

## 2021-04-25 DIAGNOSIS — M79662 Pain in left lower leg: Secondary | ICD-10-CM

## 2021-04-25 DIAGNOSIS — M25572 Pain in left ankle and joints of left foot: Secondary | ICD-10-CM

## 2021-04-25 DIAGNOSIS — R262 Difficulty in walking, not elsewhere classified: Secondary | ICD-10-CM

## 2021-04-25 NOTE — Therapy (Signed)
[facility] [address], [place], [address] Phone: [phone]   Fax:  [phone]  Physical Therapy Treatment  Patient Details  Name: Rebekah [name] MRN: [id number] Date of Birth: 1990-12-30 Referring Provider (PT): Judi Saa, DO   Encounter Date: 04/25/2021   PT End of Session - 04/25/21 0846     Visit Number 3    Number of Visits 17    Date for PT Re-Evaluation 06/09/21    Authorization Type 3    Authorization Time Period 10    PT Start Time 0846    PT Stop Time 0934    PT Time Calculation (min) 48 min    Activity Tolerance Patient tolerated treatment well    Behavior During Therapy [facility] for tasks assessed/performed             No past medical history on file.  Past Surgical History:  Procedure Laterality Date   WISDOM TOOTH EXTRACTION      There were no vitals filed for this visit.   Subjective Assessment - 04/25/21 0848     Subjective L ankle is ok. Rolled it yesterday morning. Pt was getting out of bed. Pt hit her son's mini trampoline while getting out of bed. Also has a cut at the top of her L foot (big toe, better now) after hitting it with the door. 9/10 currently. Does not like the pain numbers thing. Last week pt was able to ride 53 miles at her exercise bike. The biking was better, pain is not as bad afterwards before yesterday.    Pertinent History L lateral ankle pain. Pt rolled her L ankle all throughout elementary and high school. Pain has worsened when she got pregnant with her daughter about 19 months ago. L ankle keeps rolling even when she was just standing.  Feels radiating pain when her comforter lays on her L foot. Currently wearing an air cast last week which helps her from rolling her L ankle. L ankle currently throbs. Pain is located L pinky toe and L peroneal tendons and muscles. The top of her L foot is currently tender.    Patient Stated Goals Decrease  pain. Be able to play soccer with her kids. Be able to get out of bed and walk and not roll her ankle all the time.    Currently in Pain? Yes    Pain Score 9     Pain Onset More than a month ago                                       PT Education - 04/25/21 0928     Education Details ther-ex    Person(s) Educated Patient    Methods Explanation;Demonstration;Tactile cues;Verbal cues    Comprehension Returned demonstration;Verbalized understanding            Objective       MRI: torn peroneal brevis tendon and ATFL         No latex allergies   MD said to try PT first.   (+) SLUMP L LE with symptoms   Medbridge Access Code [email or web address]     Manual therapy    Prone STM L lateral leg and ankle area with edge tool to decrease scar tissue/fascial restrictions. Performed along the fibularis muscles and tendons.    Prone STM L lateral leg along  tib anterior to decrease fascial restrictions.              Feels good per pt.      Therapeutic exercise  prone ankle isometrics, PT manual resistance              Gentle              EV 10x3 with 5 second holds             DF 10x3 with 5 second holds  Prone AAROM EV 10x3 DF 10x3     Supine quad set (pillow behind knee) 10x5 seconds for 3 sets to promote knee extension  Improved exercise technique, movement at target joints, use of target muscles after mod verbal, visual, tactile cues.   Response to treatment Pt tolerated session well without aggravation of symptoms. Decreased pain per pt.      Clinical impression Continued working in decreasing soft tissue restrictions L lateral leg and foot. Also worked on isometric peroneal muscle activation and AAROM to promote proper stress to promote healing to affected tissues. Pt tolerated session well without aggravation of symptoms. Pt will benefit from continued skilled physical therapy services to decrease pain, improve strength and function.        PT Short Term Goals - 04/14/21 1253       PT SHORT TERM GOAL #1   Title Pt will be independent with her initial HEP to decrease pain, improve strength and ability to ambulate more comfortably.    Baseline Pt has started her HEP (04/14/2021)    Time 3    Period Weeks    Status New    Target Date 05/05/21               PT Long Term Goals - 04/14/21 1256       PT LONG TERM GOAL #1   Title Pt will have a decrease in L ankle pain to 3/10 or less at most to promote ability to ambualte as well as perform standing tasks more comfortably.    Baseline 10/10 L ankle pain at most for the past 3 months (04/14/2021)    Time 8    Period Weeks    Status New    Target Date 06/09/21      PT LONG TERM GOAL #2   Title Pt will improve L ankle strength by at least 1/2 MMT grade all planes to promote ankle stability and ability to ambulate more comfortably.    Baseline L ankle DF 3+/5, PF 4/5, IV 3-/5, EV 3-/5 (04/14/2021)    Time 8    Period Weeks    Status New    Target Date 06/09/21      PT LONG TERM GOAL #3   Title Pt will improve her FOTO score by at least 10 points as a demonstration of improved function.    Baseline L ankle FOTO 21 (04/14/2021)    Time 8    Period Weeks    Status New    Target Date 06/09/21                   Plan - 04/25/21 0928     Clinical Impression Statement Continued working in decreasing soft tissue restrictions L lateral leg and foot. Also worked on isometric peroneal muscle activation and AAROM to promote proper stress to promote healing to affected tissues. Pt tolerated session well without aggravation of symptoms. Pt will benefit from continued skilled physical therapy services to  decrease pain, improve strength and function.    Personal Factors and Comorbidities Fitness;Time since onset of injury/illness/exacerbation;Past/Current Experience    Examination-Activity Limitations Locomotion Level    Stability/Clinical Decision Making  Stable/Uncomplicated    Rehab Potential Fair    PT Frequency 2x / week    PT Duration 8 weeks    PT Treatment/Interventions Neuromuscular re-education;Therapeutic activities;Therapeutic exercise;Balance training;Patient/family education;Manual techniques;Dry needling;Aquatic Therapy;Electrical Stimulation;Iontophoresis 4mg /ml Dexamethasone    PT Next Visit Plan pain control, AROM, stability/strengthening, manual techniques, modalities PRN    PT Home Exercise Plan Medbridge Access Code [email or web address]    Consulted and Agree with Plan of Care Patient             Patient will benefit from skilled therapeutic intervention in order to improve the following deficits and impairments:  Pain, Postural dysfunction, Improper body mechanics, Difficulty walking, Decreased strength  Visit Diagnosis: Pain in left ankle and joints of left foot  Difficulty in walking, not elsewhere classified  Pain in left lower leg     Problem List Patient Active Problem List   Diagnosis Date Noted   Foreign body in left foot 01/06/2021   Left ankle pain 12/06/2020   Left lower quadrant abdominal pain of unknown etiology 12/24/2017   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, [address], [place] Phone: [phone]   Fax:  [phone]  Name: Rebekah [name] MRN: Lloyd Huger Date of Birth: 24-Jul-1991

## 2021-04-27 ENCOUNTER — Ambulatory Visit: Payer: BC Managed Care – PPO

## 2021-04-27 DIAGNOSIS — M25572 Pain in left ankle and joints of left foot: Secondary | ICD-10-CM | POA: Diagnosis not present

## 2021-04-27 DIAGNOSIS — M79662 Pain in left lower leg: Secondary | ICD-10-CM

## 2021-04-27 DIAGNOSIS — R262 Difficulty in walking, not elsewhere classified: Secondary | ICD-10-CM

## 2021-04-27 NOTE — Therapy (Signed)
[place] [facility] [address] [place], [address] Phone: [phone]   Fax:  [phone]  Physical Therapy Treatment  Patient Details  Name: Rebekah [name] MRN: [id number] Date of Birth: May 05, 1991 Referring Provider (PT): Judi Saa, DO   Encounter Date: 04/27/2021   PT End of Session - 04/27/21 0924     Visit Number 4    Number of Visits 17    Date for PT Re-Evaluation 06/09/21    Authorization Type 4    Authorization Time Period 10    PT Start Time 0847    PT Stop Time 0925    PT Time Calculation (min) 38 min    Activity Tolerance Patient tolerated treatment well    Behavior During Therapy [facility] for tasks assessed/performed             No past medical history on file.  Past Surgical History:  Procedure Laterality Date   WISDOM TOOTH EXTRACTION      There were no vitals filed for this visit.   Subjective Assessment - 04/27/21 0848     Subjective L ankle feels a little bit better to day than Monday. 5/10 currently.    Pertinent History L lateral ankle pain. Pt rolled her L ankle all throughout elementary and high school. Pain has worsened when she got pregnant with her daughter about 19 months ago. L ankle keeps rolling even when she was just standing.  Feels radiating pain when her comforter lays on her L foot. Currently wearing an air cast last week which helps her from rolling her L ankle. L ankle currently throbs. Pain is located L pinky toe and L peroneal tendons and muscles. The top of her L foot is currently tender.    Patient Stated Goals Decrease pain. Be able to play soccer with her kids. Be able to get out of bed and walk and not roll her ankle all the time.    Currently in Pain? Yes    Pain Score 5     Pain Onset More than a month ago                                       PT Education - 04/27/21 0924     Education Details ther-ex     Person(s) Educated Patient    Methods Explanation;Demonstration;Tactile cues;Verbal cues    Comprehension Returned demonstration;Verbalized understanding           Objective       MRI: torn peroneal brevis tendon and ATFL         No latex allergies   MD said to try PT first.   (+) SLUMP L LE with symptoms   Medbridge Access Code [email or web address]     Manual therapy  Prone STM L lateral leg and ankle area with edge tool to decrease scar tissue/fascial restrictions. Performed along the fibularis muscles and tendons.    Prone STM L lateral leg along tib anterior to decrease fascial restrictions.              Feels good per pt.      Therapeutic exercise   prone ankle isometrics, PT manual resistance              Gentle              EV 10x3 with 5 second  holds             DF 10x3 with 5 second holds   Prone AAROM EV 10x3 DF 10x3      Supine quad set (pillow behind knee) 10x5 seconds for 3 sets to promote knee extension   Improved exercise technique, movement at target joints, use of target muscles after mod verbal, visual, tactile cues.   Response to treatment Pt tolerated session well without aggravation of symptoms. No pain reported after session.       Clinical impression Decreasing starting pain levels based on subjective reports. Better able to resist L ankle EV without complain of pain. Continued with manual therapy to decrease soft tissue/fascial restrictions followed by isometric and concentric loading to affected tissues to promote proper healing. Pt tolerated session well without aggravation of symptoms. Pt will benefit from continued skilled physical therapy services to decrease pain, improve strength and function.           PT Short Term Goals - 04/14/21 1253       PT SHORT TERM GOAL #1   Title Pt will be independent with her initial HEP to decrease pain, improve strength and ability to ambulate more comfortably.    Baseline Pt has started her HEP  (04/14/2021)    Time 3    Period Weeks    Status New    Target Date 05/05/21               PT Long Term Goals - 04/14/21 1256       PT LONG TERM GOAL #1   Title Pt will have a decrease in L ankle pain to 3/10 or less at most to promote ability to ambualte as well as perform standing tasks more comfortably.    Baseline 10/10 L ankle pain at most for the past 3 months (04/14/2021)    Time 8    Period Weeks    Status New    Target Date 06/09/21      PT LONG TERM GOAL #2   Title Pt will improve L ankle strength by at least 1/2 MMT grade all planes to promote ankle stability and ability to ambulate more comfortably.    Baseline L ankle DF 3+/5, PF 4/5, IV 3-/5, EV 3-/5 (04/14/2021)    Time 8    Period Weeks    Status New    Target Date 06/09/21      PT LONG TERM GOAL #3   Title Pt will improve her FOTO score by at least 10 points as a demonstration of improved function.    Baseline L ankle FOTO 21 (04/14/2021)    Time 8    Period Weeks    Status New    Target Date 06/09/21                   Plan - 04/27/21 0924     Clinical Impression Statement Decreasing starting pain levels based on subjective reports. Better able to resist L ankle EV without complain of pain. Continued with manual therapy to decrease soft tissue/fascial restrictions followed by isometric and concentric loading to affected tissues to promote proper healing. Pt tolerated session well without aggravation of symptoms. Pt will benefit from continued skilled physical therapy services to decrease pain, improve strength and function.    Personal Factors and Comorbidities Fitness;Time since onset of injury/illness/exacerbation;Past/Current Experience    Examination-Activity Limitations Locomotion Level    Stability/Clinical Decision Making Stable/Uncomplicated    Rehab Potential Fair  PT Frequency 2x / week    PT Duration 8 weeks    PT Treatment/Interventions Neuromuscular re-education;Therapeutic  activities;Therapeutic exercise;Balance training;Patient/family education;Manual techniques;Dry needling;Aquatic Therapy;Electrical Stimulation;Iontophoresis 4mg /ml Dexamethasone    PT Next Visit Plan pain control, AROM, stability/strengthening, manual techniques, modalities PRN    PT Home Exercise Plan Medbridge Access Code [email or web address]    Consulted and Agree with Plan of Care Patient             Patient will benefit from skilled therapeutic intervention in order to improve the following deficits and impairments:  Pain, Postural dysfunction, Improper body mechanics, Difficulty walking, Decreased strength  Visit Diagnosis: Pain in left ankle and joints of left foot  Difficulty in walking, not elsewhere classified  Pain in left lower leg     Problem List Patient Active Problem List   Diagnosis Date Noted   Foreign body in left foot 01/06/2021   Left ankle pain 12/06/2020   Left lower quadrant abdominal pain of unknown etiology 12/24/2017   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 [address], [place] Phone: [phone]   Fax:  [phone]  Name: Rebekah [name] MRN: Lloyd Huger Date of Birth: 04-21-1991

## 2021-05-04 ENCOUNTER — Ambulatory Visit: Payer: BC Managed Care – PPO

## 2021-05-09 ENCOUNTER — Ambulatory Visit: Payer: BC Managed Care – PPO

## 2021-05-11 ENCOUNTER — Ambulatory Visit: Payer: BC Managed Care – PPO | Attending: Family Medicine

## 2021-05-11 DIAGNOSIS — M25572 Pain in left ankle and joints of left foot: Secondary | ICD-10-CM | POA: Insufficient documentation

## 2021-05-11 DIAGNOSIS — M79662 Pain in left lower leg: Secondary | ICD-10-CM | POA: Insufficient documentation

## 2021-05-11 DIAGNOSIS — R262 Difficulty in walking, not elsewhere classified: Secondary | ICD-10-CM | POA: Diagnosis present

## 2021-05-11 NOTE — Therapy (Signed)
[place] [facility] [address] [place], [address] Phone: [phone]   Fax:  [phone]  Physical Therapy Treatment  Patient Details  Name: Rebekah [name] MRN: [id number] Date of Birth: [birth year] Referring Provider (PT): Judi Saa, DO   Encounter Date: 05/11/2021   PT End of Session - 05/11/21 0850     Visit Number 5    Number of Visits 17    Date for PT Re-Evaluation 06/09/21    Authorization Type 5    Authorization Time Period 10    PT Start Time 0850    PT Stop Time 0929    PT Time Calculation (min) 39 min    Activity Tolerance Patient tolerated treatment well    Behavior During Therapy [facility] for tasks assessed/performed             No past medical history on file.  Past Surgical History:  Procedure Laterality Date   WISDOM TOOTH EXTRACTION      There were no vitals filed for this visit.   Subjective Assessment - 05/11/21 0851     Subjective L ankle feels aweful. Had to do a lot of moving related stuff from one house to her new house. Has L lateral leg pain now. Was not able to do any of her HEP.    Pertinent History L lateral ankle pain. Pt rolled her L ankle all throughout elementary and high school. Pain has worsened when she got pregnant with her daughter about 19 months ago. L ankle keeps rolling even when she was just standing.  Feels radiating pain when her comforter lays on her L foot. Currently wearing an air cast last week which helps her from rolling her L ankle. L ankle currently throbs. Pain is located L pinky toe and L peroneal tendons and muscles. The top of her L foot is currently tender.    Patient Stated Goals Decrease pain. Be able to play soccer with her kids. Be able to get out of bed and walk and not roll her ankle all the time.    Currently in Pain? Yes    Pain Score 9     Pain Onset More than a month ago                                        PT Education - 05/11/21 1137     Education Details manual therapy    Person(s) Educated Patient    Methods Explanation    Comprehension Verbalized understanding           Objective       MRI: torn peroneal brevis tendon and ATFL         No latex allergies   MD said to try PT first.   (+) SLUMP L LE with symptoms   Medbridge Access Code [email or web address]     Manual therapy  Supine medial rotation mobilization L tibia grade 1 to 3- for pain control   Supine STM R lateral hamstrings to decrease muscle tension and improve L leg mechanics during gait.   Supine STM L lateral leg to decrease fascial restrictions  Then with edge tool       Response to treatment Decreased L plantar foot numbness. Decreased tigness with forward gait after session. Increased symptoms with moving (to her new home) activities based on clinical presentation and  subjective reports.       Clinical impression Pt returns to PT today with irritated L LE symptoms secondary to physical stresses from moving to a new home. Utilized manual therapy today to help decrease pain and soft tissue restrictions L lateral leg. Decreased L plantar foot numbness and L lateral leg tightness with forward ambulation after session. Pt will benefit from continued skilled physical therapy services to decrease pain, improve strength and function.     PT Short Term Goals - 04/14/21 1253       PT SHORT TERM GOAL #1   Title Pt will be independent with her initial HEP to decrease pain, improve strength and ability to ambulate more comfortably.    Baseline Pt has started her HEP (04/14/2021)    Time 3    Period Weeks    Status New    Target Date 05/05/21               PT Long Term Goals - 04/14/21 1256       PT LONG TERM GOAL #1   Title Pt will have a decrease in L ankle pain to 3/10 or less at most to promote ability to ambualte as well as perform standing  tasks more comfortably.    Baseline 10/10 L ankle pain at most for the past 3 months (04/14/2021)    Time 8    Period Weeks    Status New    Target Date 06/09/21      PT LONG TERM GOAL #2   Title Pt will improve L ankle strength by at least 1/2 MMT grade all planes to promote ankle stability and ability to ambulate more comfortably.    Baseline L ankle DF 3+/5, PF 4/5, IV 3-/5, EV 3-/5 (04/14/2021)    Time 8    Period Weeks    Status New    Target Date 06/09/21      PT LONG TERM GOAL #3   Title Pt will improve her FOTO score by at least 10 points as a demonstration of improved function.    Baseline L ankle FOTO 21 (04/14/2021)    Time 8    Period Weeks    Status New    Target Date 06/09/21                   Plan - 05/11/21 1137     Clinical Impression Statement Pt returns to PT today with irritated L LE symptoms secondary to physical stresses from moving to a new home. Utilized manual therapy today to help decrease pain and soft tissue restrictions L lateral leg. Decreased L plantar foot numbness and L lateral leg tightness with forward ambulation after session. Pt will benefit from continued skilled physical therapy services to decrease pain, improve strength and function.    Personal Factors and Comorbidities Fitness;Time since onset of injury/illness/exacerbation;Past/Current Experience    Examination-Activity Limitations Locomotion Level    Stability/Clinical Decision Making Stable/Uncomplicated    Rehab Potential Fair    PT Frequency 2x / week    PT Duration 8 weeks    PT Treatment/Interventions Neuromuscular re-education;Therapeutic activities;Therapeutic exercise;Balance training;Patient/family education;Manual techniques;Dry needling;Aquatic Therapy;Electrical Stimulation;Iontophoresis 4mg /ml Dexamethasone    PT Next Visit Plan pain control, AROM, stability/strengthening, manual techniques, modalities PRN    PT Home Exercise Plan Medbridge Access Code [email or web address]     Consulted and Agree with Plan of Care Patient             Patient will benefit from skilled  therapeutic intervention in order to improve the following deficits and impairments:  Pain, Postural dysfunction, Improper body mechanics, Difficulty walking, Decreased strength  Visit Diagnosis: Pain in left ankle and joints of left foot  Pain in left lower leg  Difficulty in walking, not elsewhere classified     Problem List Patient Active Problem List   Diagnosis Date Noted   Foreign body in left foot 01/06/2021   Left ankle pain 12/06/2020   Left lower quadrant abdominal pain of unknown etiology 12/24/2017   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, [place], [address] Phone: [phone]   Fax:  [phone]  Name: Rebekah [name] MRN: [id number] Date of Birth: 1991-06-27

## 2021-05-16 ENCOUNTER — Ambulatory Visit: Payer: BC Managed Care – PPO

## 2021-05-16 DIAGNOSIS — M79662 Pain in left lower leg: Secondary | ICD-10-CM

## 2021-05-16 DIAGNOSIS — M25572 Pain in left ankle and joints of left foot: Secondary | ICD-10-CM | POA: Diagnosis not present

## 2021-05-16 DIAGNOSIS — R262 Difficulty in walking, not elsewhere classified: Secondary | ICD-10-CM

## 2021-05-16 NOTE — Therapy (Signed)
[facility] [address] [place], [address] Phone: [phone]   Fax:  [phone]  Physical Therapy Treatment  Patient Details  Name: Rebekah [name] MRN: [id number] Date of Birth: May 20, 1991 Referring Provider (PT): Judi Saa, DO   Encounter Date: 05/16/2021   PT End of Session - 05/16/21 1022     Visit Number 6    Number of Visits 17    Date for PT Re-Evaluation 06/09/21    Authorization Type 6    Authorization Time Period 10    PT Start Time 0850    PT Stop Time 0932    PT Time Calculation (min) 42 min    Activity Tolerance Patient tolerated treatment well    Behavior During Therapy [facility] for tasks assessed/performed             No past medical history on file.  Past Surgical History:  Procedure Laterality Date   WISDOM TOOTH EXTRACTION      There were no vitals filed for this visit.   Subjective Assessment - 05/16/21 1022     Subjective Rolled her L ankle again wearing her ankle brace. But not as bad as she was here last Wednesday. Pain is not shooting up to the knee from the foot. Just the bottom half of her L lateral leg. About 7/10 currently.    Pertinent History L lateral ankle pain. Pt rolled her L ankle all throughout elementary and high school. Pain has worsened when she got pregnant with her daughter about 19 months ago. L ankle keeps rolling even when she was just standing.  Feels radiating pain when her comforter lays on her L foot. Currently wearing an air cast last week which helps her from rolling her L ankle. L ankle currently throbs. Pain is located L pinky toe and L peroneal tendons and muscles. The top of her L foot is currently tender.    Patient Stated Goals Decrease pain. Be able to play soccer with her kids. Be able to get out of bed and walk and not roll her ankle all the time.    Currently in Pain? Yes    Pain Score 7     Pain Onset More than a month ago                                        PT Education - 05/16/21 1022     Education Details ther-ex    [employer]) Educated Patient    Methods Explanation;Demonstration;Tactile cues;Verbal cues    Comprehension Returned demonstration;Verbalized understanding              Objective       MRI: torn peroneal brevis tendon and ATFL         No latex allergies   MD said to try PT first.   (+) SLUMP L LE with symptoms   Medbridge Access Code [email or web address]     Manual therapy R S/L STM with Edge tool L lateral leg along the peroneal muscles and tendons   Therapeutic exercise Supine manually resisted isometric L ankle EV, PT manual resistance 10x5 seconds, then 5x5 seconds. Uncomfortable  Supine ankle DF 10x3  Seated L LE neural flossing 10x2  Seated L hip extension isometrics, with L foot on Air ex pad to promote L ankle stability 10x3  Bridge for L foot  weight bearing. B hamstring cramps  Prone press-ups secondary to possible disc involvement 10x5 seconds   Supine bridge again. Cramps, L hamstrings. Increased symptoms with standing up straight.   Prone on elbows. Decreased cramps.   Recommended for pt to perform at home. Pt demonstrated and verbalized understanding.    Improved exercise technique, movement at target joints, use of target muscles after min to mod verbal, visual, tactile cues.    Response to treatment Cramps B hamstrings with bridge exercise, decreases with gentle lumbar extension such as prone on elbows.       Clinical impression Possible low back involvement secondary to presence of B hamstring cramps which decreases with gentle back extension. Possibly from lifting heavy items when moving to a new home. Continued working decreasing soft tissue restrictions L lateral leg, followed by isometrics and gentle movement to promote proper stress to healing tissues. Fair tolerance to today's session. Pt will benefit from continued  skilled physical therapy services to decrease pain, improve strength and function. Challenges to progress include chronicity of condition as well as recent move from one house to another which involve heavy lifting which may have irritated L foot and ankle.      PT Short Term Goals - 04/14/21 1253       PT SHORT TERM GOAL #1   Title Pt will be independent with her initial HEP to decrease pain, improve strength and ability to ambulate more comfortably.    Baseline Pt has started her HEP (04/14/2021)    Time 3    Period Weeks    Status New    Target Date 05/05/21               PT Long Term Goals - 04/14/21 1256       PT LONG TERM GOAL #1   Title Pt will have a decrease in L ankle pain to 3/10 or less at most to promote ability to ambualte as well as perform standing tasks more comfortably.    Baseline 10/10 L ankle pain at most for the past 3 months (04/14/2021)    Time 8    Period Weeks    Status New    Target Date 06/09/21      PT LONG TERM GOAL #2   Title Pt will improve L ankle strength by at least 1/2 MMT grade all planes to promote ankle stability and ability to ambulate more comfortably.    Baseline L ankle DF 3+/5, PF 4/5, IV 3-/5, EV 3-/5 (04/14/2021)    Time 8    Period Weeks    Status New    Target Date 06/09/21      PT LONG TERM GOAL #3   Title Pt will improve her FOTO score by at least 10 points as a demonstration of improved function.    Baseline L ankle FOTO 21 (04/14/2021)    Time 8    Period Weeks    Status New    Target Date 06/09/21                   Plan - 05/16/21 1123     Clinical Impression Statement Possible low back involvement secondary to presence of B hamstring cramps which decreases with gentle back extension. Possibly from lifting heavy items when moving to a new home. Continued working decreasing soft tissue restrictions L lateral leg, followed by isometrics and gentle movement to promote proper stress to healing tissues. Fair  tolerance to today's session. Pt will benefit  from continued skilled physical therapy services to decrease pain, improve strength and function. Challenges to progress include chronicity of condition as well as recent move from one house to another which involve heavy lifting which may have irritated L foot and ankle.    Personal Factors and Comorbidities Fitness;Time since onset of injury/illness/exacerbation;Past/Current Experience    Examination-Activity Limitations Locomotion Level    Stability/Clinical Decision Making Stable/Uncomplicated    Rehab Potential Fair    PT Frequency 2x / week    PT Duration 8 weeks    PT Treatment/Interventions Neuromuscular re-education;Therapeutic activities;Therapeutic exercise;Balance training;Patient/family education;Manual techniques;Dry needling;Aquatic Therapy;Electrical Stimulation;Iontophoresis 4mg /ml Dexamethasone    PT Next Visit Plan pain control, AROM, stability/strengthening, manual techniques, modalities PRN    PT Home Exercise Plan Medbridge Access Code [email or web address]    Consulted and Agree with Plan of Care Patient             Patient will benefit from skilled therapeutic intervention in order to improve the following deficits and impairments:  Pain, Postural dysfunction, Improper body mechanics, Difficulty walking, Decreased strength  Visit Diagnosis: Pain in left ankle and joints of left foot  Pain in left lower leg  Difficulty in walking, not elsewhere classified     Problem List Patient Active Problem List   Diagnosis Date Noted   Foreign body in left foot 01/06/2021   Left ankle pain 12/06/2020   Left lower quadrant abdominal pain of unknown etiology 12/24/2017   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, [place] Phone: [phone]   Fax:   [phone]  Name: Lorann Tani MRN: Lloyd Huger Date of Birth: 1990/12/31

## 2021-05-16 NOTE — Patient Instructions (Signed)
Prone on elbows. Decreased cramps.   Recommended for pt to perform at home. Pt demonstrated and verbalized understanding.

## 2021-05-17 ENCOUNTER — Ambulatory Visit (INDEPENDENT_AMBULATORY_CARE_PROVIDER_SITE_OTHER): Payer: BC Managed Care – PPO

## 2021-05-17 ENCOUNTER — Other Ambulatory Visit: Payer: Self-pay

## 2021-05-17 ENCOUNTER — Encounter: Payer: Self-pay | Admitting: Emergency Medicine

## 2021-05-17 ENCOUNTER — Ambulatory Visit
Admission: EM | Admit: 2021-05-17 | Discharge: 2021-05-17 | Disposition: A | Discharge status: Home / Self Care | Payer: BC Managed Care – PPO | Attending: Family Medicine | Admitting: Family Medicine

## 2021-05-17 DIAGNOSIS — M79674 Pain in right toe(s): Secondary | ICD-10-CM | POA: Diagnosis not present

## 2021-05-17 DIAGNOSIS — M79671 Pain in right foot: Secondary | ICD-10-CM | POA: Diagnosis not present

## 2021-05-17 DIAGNOSIS — S92514A Nondisplaced fracture of proximal phalanx of right lesser toe(s), initial encounter for closed fracture: Secondary | ICD-10-CM

## 2021-05-17 IMAGING — CR DG FOOT COMPLETE 3+V*R*
4 series · 4 of 4 positions shown · non-contrast
Comparison: None.

CLINICAL DATA: Injured foot last evening. Pain involving the fifth
toe.

EXAM:
RIGHT FOOT COMPLETE - 3+ VIEW

[foot ap]
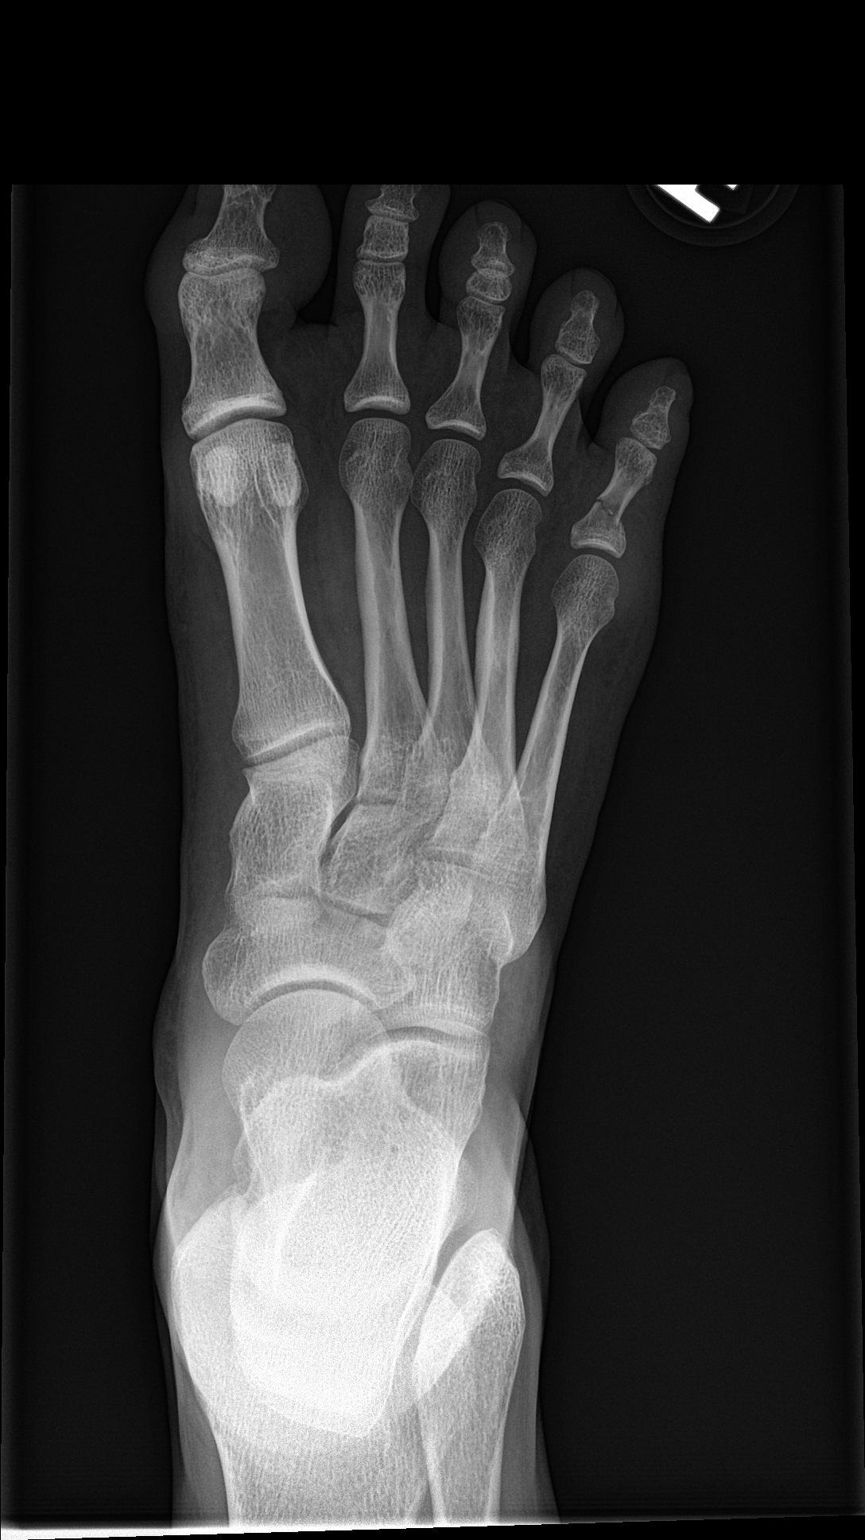

[foot obl]
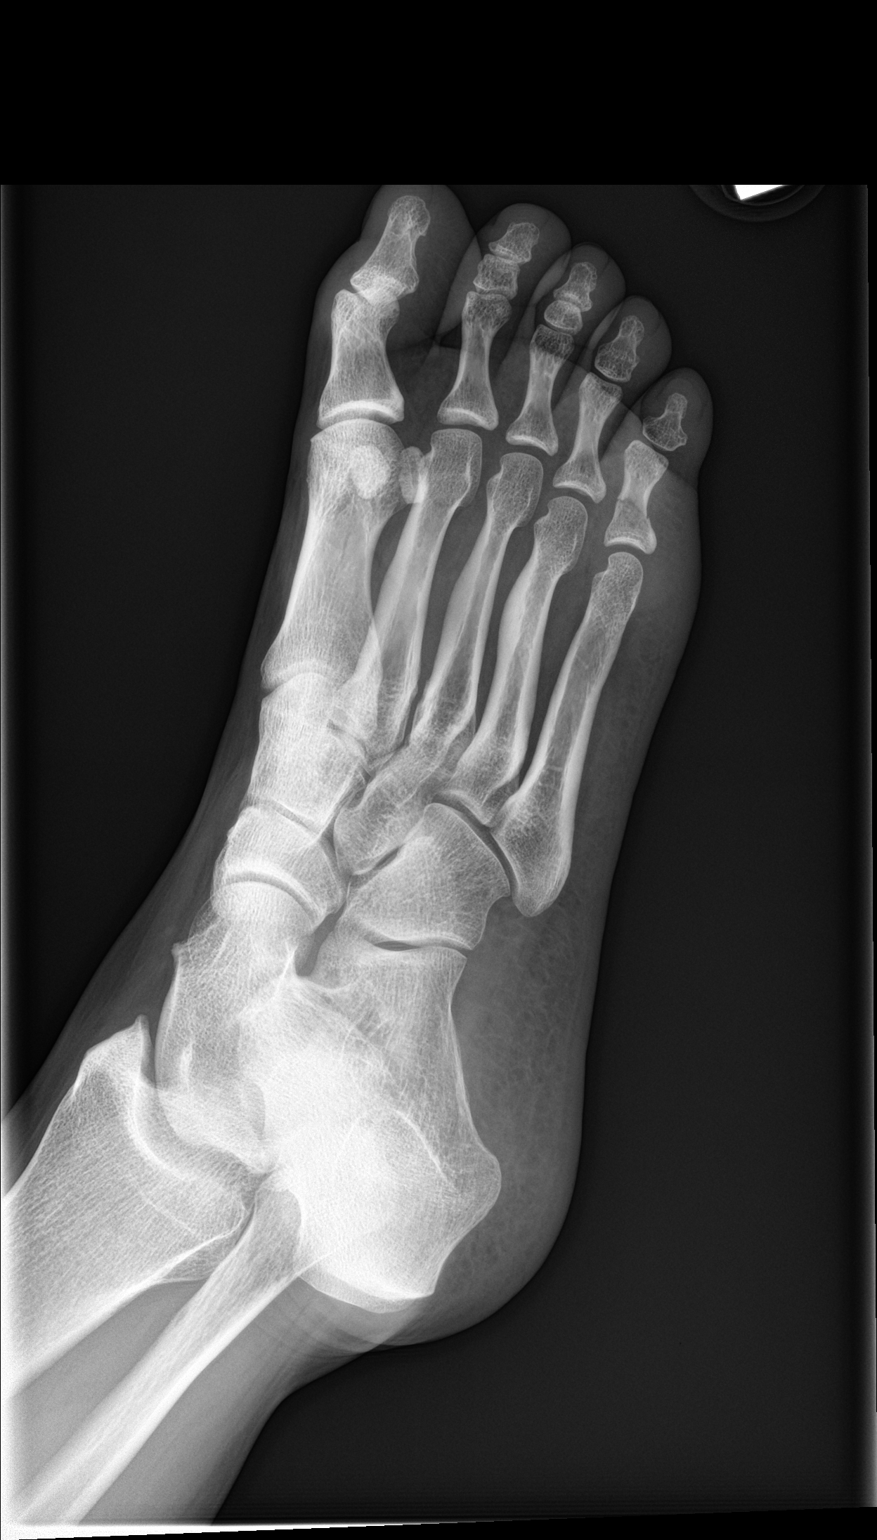

[foot lat (1 of 2)]
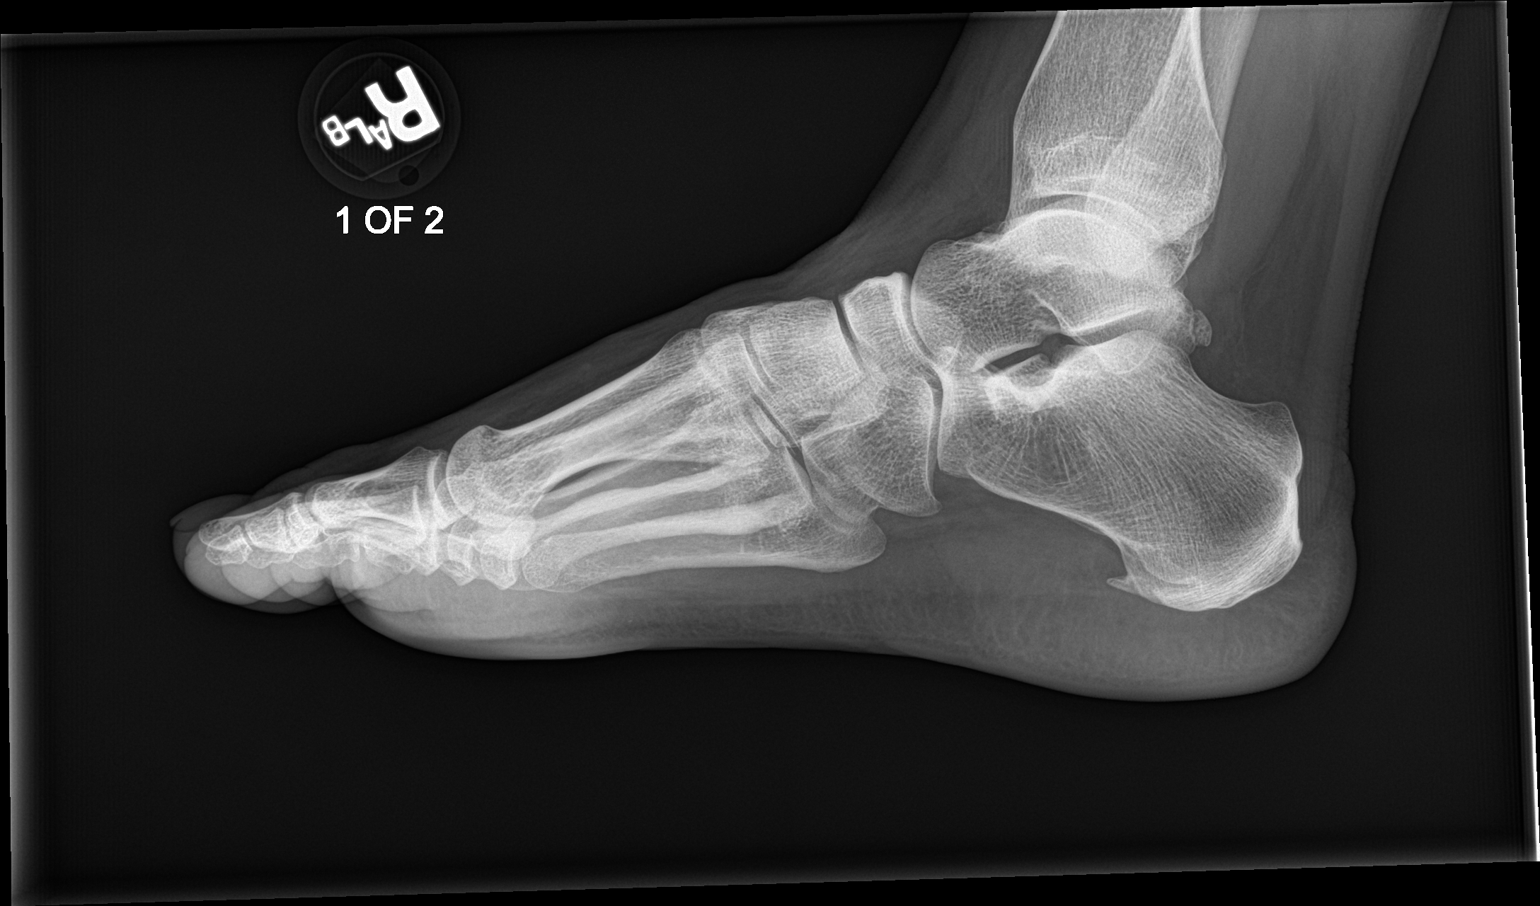

[foot lat (2 of 2)]
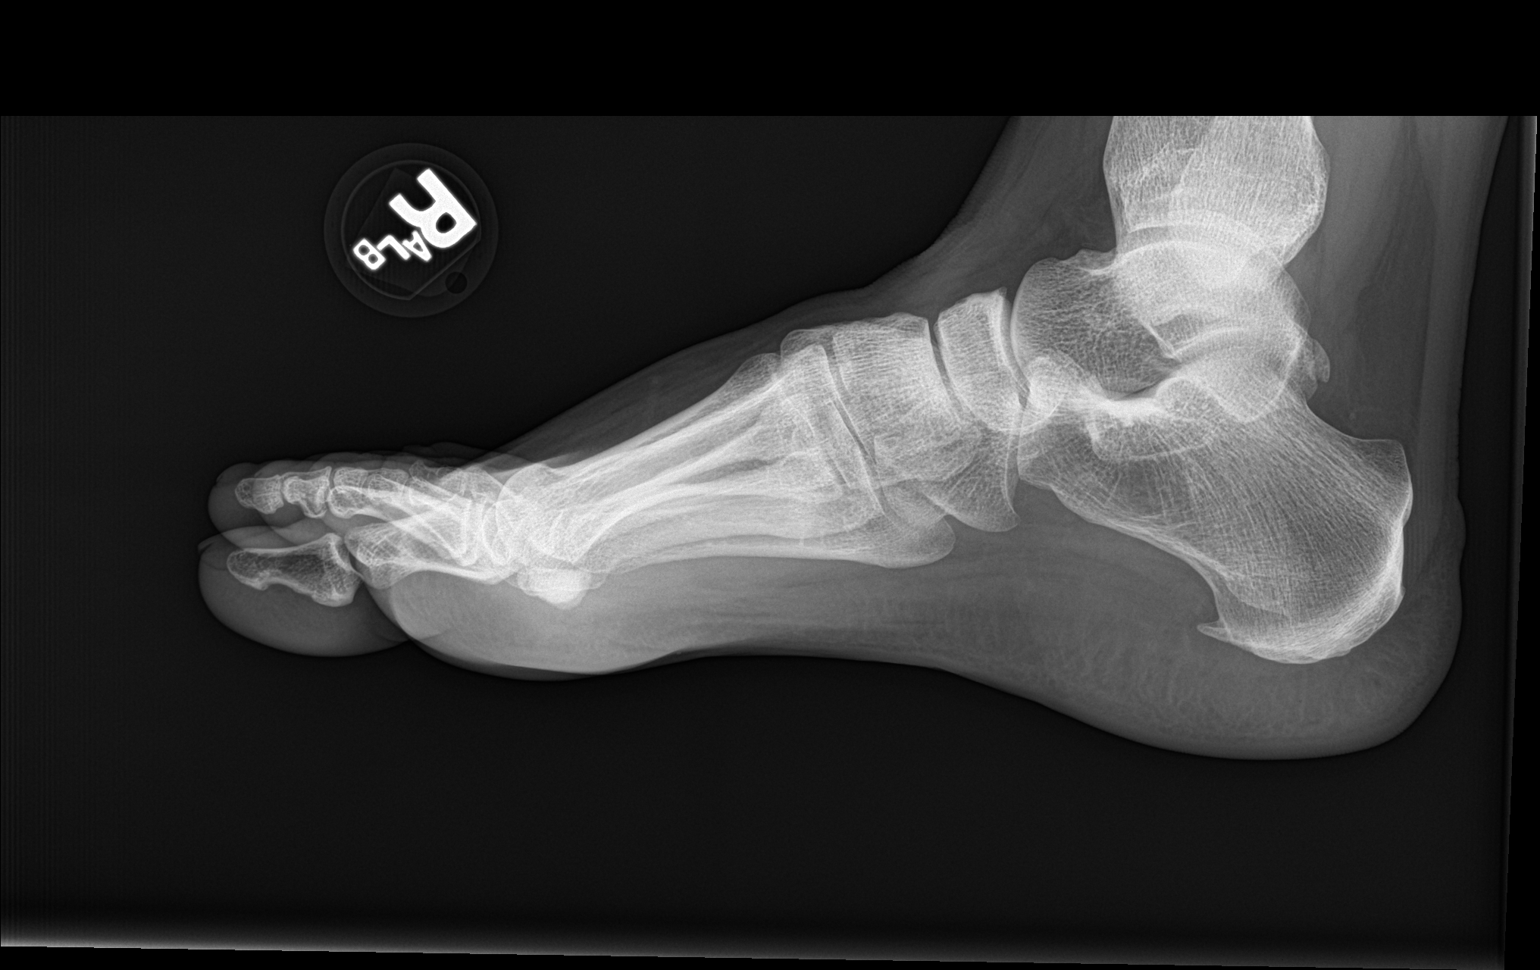

[4 of 4 positions shown; findings below may reference images not displayed]

FINDINGS: There is a largely transverse fracture involving the proximal shaft
of the proximal phalanx of the fifth toe. Slight angulation but no
significant displacement. No intra-articular involvement.

The other bony structures are intact. No other fractures are
identified. Small calcaneal heel spur noted.
IMPRESSION: Fifth proximal phalanx fracture as detailed above.

## 2021-05-17 NOTE — ED Triage Notes (Signed)
Pt c/o right foot pain, specifically her right pinky toe. She states she stubbed her toe last night. Her foot is swollen and bruised.

## 2021-05-17 NOTE — ED Provider Notes (Signed)
[facility]    CSN: [id number] Arrival date & time: 05/17/21  1002      History   Chief Complaint Chief Complaint  Patient presents with   Foot Pain    right    HPI Deshanae Lindo is a 30 y.o. female presenting for pain, swelling and bruising of the right fifth digit.  Patient says that she stubbed her toe last night on a box of stuff that she was moving.  Patient says that she has not taken anything for pain because NSAIDs and Tylenol upset her stomach.  She has also not applied any ice to the area.  She denies any numbness, weakness or tingling.  Patient is being treated by orthopedics for a sprain of her left ankle.  She has an Aircast in place and sees an orthopedist in [place].  She has an upcoming appointment in 3 weeks.  Patient has no other complaints.  HPI  History reviewed. No pertinent past medical history.  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      Home Medications    Prior to Admission medications   Medication Sig Start Date End Date Taking? Authorizing Provider  albuterol (VENTOLIN HFA) 108 (90 Base) MCG/ACT inhaler INHALE 2 PUFFS BY MOUTH EVERY 6 HOURS ASNEEDED WHEEZING 01/09/19  Yes [provider]  budesonide-formoterol (SYMBICORT) 80-4.5 MCG/ACT inhaler Inhale into the lungs. 11/05/19  Yes [provider]    Family History History reviewed. No pertinent family history.  Social History Social History   Tobacco Use   Smoking status: Never   Smokeless tobacco: Never  Vaping Use   Vaping Use: Never used  Substance Use Topics   Alcohol use: Yes   Drug use: Not Currently      Allergies   Sumatriptan succinate   Review of Systems Review of Systems  Musculoskeletal:  Positive for arthralgias, gait problem and joint swelling.  Skin:  Positive for color change. Negative for wound.  Neurological:  Negative for weakness and numbness.    Physical Exam Triage Vital Signs ED Triage Vitals  Enc Vitals Group     BP 05/17/21 1020 (!) 141/85     Pulse Rate 05/17/21 1020 96     Resp 05/17/21 1020 18     Temp 05/17/21 1020 98.5 F (36.9 C)     Temp Source 05/17/21 1020 Oral     SpO2 05/17/21 1020 100 %     Weight 05/17/21 1018 192 lb 0.3 oz (87.1 kg)     Height 05/17/21 1018 5\' 1"  (1.549 m)     Head Circumference --      Peak Flow --      Pain Score 05/17/21 1018 10     Pain Loc --      Pain Edu? --      Excl. in GC? --    No data found.  Updated Vital Signs BP (!) 141/85 (BP Location: Right Arm)   Pulse 96   Temp 98.5 F (36.9 C) (Oral)   Resp 18   Ht 5\' 1"  (1.549 m)   Wt 192 lb  0.3 oz (87.1 kg)   LMP 05/10/2021 (Approximate)   SpO2 100%   BMI 36.28 kg/m       Physical Exam Vitals and nursing note reviewed.  Constitutional:      General: She is not in acute distress.    Appearance: Normal appearance. She is not ill-appearing or toxic-appearing.  HENT:     Head: Normocephalic and atraumatic.  Eyes:     General: No scleral icterus.       Right eye: No discharge.        Left eye: No discharge.     Conjunctiva/sclera: Conjunctivae normal.  Cardiovascular:     Rate and Rhythm: Normal rate and regular rhythm.     Pulses: Normal pulses.  Pulmonary:     Effort: Pulmonary effort is normal. No respiratory distress.  Musculoskeletal:     Cervical back: Neck supple.     Right foot: Decreased range of motion. Swelling (mild/moderate swelling and ecchymosis 5th digit) and bony tenderness (proximal and distal phalanx of 5th toe) present.  Skin:    General: Skin is dry.  Neurological:     General: No focal deficit present.     Mental  Status: She is alert. Mental status is at baseline.     Motor: No weakness.     Gait: Gait abnormal.  Psychiatric:        Mood and Affect: Mood normal.        Behavior: Behavior normal.        Thought Content: Thought content normal.     UC Treatments / Results  Labs (all labs ordered are listed, but only abnormal results are displayed) Labs Reviewed - No data to display  EKG   Radiology DG Foot Complete Right  Result Date: 05/17/2021 CLINICAL DATA:  Injured foot last evening. Pain involving the fifth toe. EXAM: RIGHT FOOT COMPLETE - 3+ VIEW COMPARISON:  None. FINDINGS: There is a largely transverse fracture involving the proximal shaft of the proximal phalanx of the fifth toe. Slight angulation but no significant displacement. No intra-articular involvement. The other bony structures are intact. No other fractures are identified. Small calcaneal heel spur noted. IMPRESSION: Fifth proximal phalanx fracture as detailed above. Electronically Signed   By: Rudie Meyer M.D.   On: 05/17/2021 10:56    Procedures Procedures (including critical care time)  Medications Ordered in UC Medications - No data to display  Initial Impression / Assessment and Plan / UC Course  I have reviewed the triage vital signs and the nursing notes.  Pertinent labs & imaging results that were available during my care of the patient were reviewed by me and considered in my medical decision making (see chart for details).   31 y/o female presenting for toe pain right 5th toe.  X-ray does reveal fracture of the fifth proximal phalanx.  Reviewed this result with patient.  Advised postop shoe and buddy tape.  Patient stating that she cannot take any over-the-counter medications for pain because they upset her reflux.  I did offer her short supply of tramadol but she says that it does not help her with pain and declines it.  I advised her I do not feel comfortable giving her anything stronger for pain and if she is  having significant or worsening pain she needs to follow-up with her orthopedist.  Advised following RICE guidelines, Voltaren gel and trying to stay off of it is much as possible.   Final Clinical Impressions(s) / UC Diagnoses   Final  diagnoses:  Closed nondisplaced fracture of proximal phalanx of lesser toe of right foot, initial encounter     Discharge Instructions      You do have a fracture of your toe.  We have placed you in a postop shoe and buddy tape your toes.  You can follow-up with your orthopedist.  Ice the area.  You may also get some relief with over-the-counter Voltaren gel since you state you cannot take Tylenol or Motrin/NSAIDs because it upsets her stomach.  I did offer to give you a short supply of tramadol but you have declined.  If you feel like you are unable to control your pain, please follow-up with your orthopedist sooner.  Try to stay off of it is much as possible elevated.     ED Prescriptions   None    I have reviewed the PDMP during this encounter.   Shirlee Latch, PA-C 05/17/21 1107

## 2021-05-17 NOTE — Discharge Instructions (Addendum)
You do have a fracture of your toe.  We have placed you in a postop shoe and buddy tape your toes.  You can follow-up with your orthopedist.  Ice the area.  You may also get some relief with over-the-counter Voltaren gel since you state you cannot take Tylenol or Motrin/NSAIDs because it upsets her stomach.  I did offer to give you a short supply of tramadol but you have declined.  If you feel like you are unable to control your pain, please follow-up with your orthopedist sooner.  Try to stay off of it is much as possible elevated.

## 2021-05-18 ENCOUNTER — Ambulatory Visit: Payer: BC Managed Care – PPO

## 2021-05-19 NOTE — Progress Notes (Deleted)
[name] [address] [place] [address] Phone: [phone] Subjective:    I'm seeing this patient by the request  of:  Alan Mulder, MD  CC:   [id number]  01/06/2021  Patient continues to have unfortunately instability of the ankle.  Patient does have laxity of the ATFL.  Patient continues to hurt herself she states.  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  Patient has done multiple different things in the past including formal physical therapy and has now been doing home exercises with and not getting any better.  Patient has been doing the heel lift.  I do believe advanced imaging is warranted.  Patient due to the instability no one knee surgery but patient would be open to that if necessary.  Patient will follow up after the imaging and will discuss further.  Patient on x-ray does have a foreign body in the left foot.  I do believe that this is contributing to some of the ergonomics.  Patient states that she has already had intervention for this previously and was told that it was gone.  Patient is severely tender to this.  This was an incidental finding on x-ray but is severely tender in the area.  No sign of any infectious etiology on inspection today or on ultrasound but I do feel that further evaluation would be warranted.  Follow-up after imaging  05/20/2021 Rebekah [name] is a 30 y.o. female coming in with complaint of left ankle pain.   Onset-  Location Duration-  Character- Aggravating factors- Reliving factors-  Therapies tried-  Severity-     No past medical history on file. Past Surgical History:  Procedure Laterality Date   WISDOM TOOTH EXTRACTION     Social History   Socioeconomic History   Marital status: Married    Spouse name: Not on file   Number of children: Not on file   Years of education: Not on file   Highest education level: Not on file  Occupational  History   Not on file  Tobacco Use   Smoking status: Never   Smokeless tobacco: Never  Vaping Use   Vaping Use: Never used  Substance and Sexual Activity   Alcohol use: Yes   Drug use: Not Currently   Sexual activity: Not on file  Other Topics Concern   Not on file  Social History Narrative   ** Merged History Encounter **       Social Determinants of Health   Financial Resource Strain: Not on file  Food Insecurity: Not on file  Transportation Needs: Not on file  Physical Activity: Not on file  Stress: Not on file  Social Connections: Not on file   Allergies  Allergen Reactions   Sumatriptan Succinate Other (See Comments)    Muscle spasms   No family history on file.    Current Outpatient Medications (Respiratory):    albuterol (VENTOLIN HFA) 108 (90 Base) MCG/ACT inhaler, INHALE 2 PUFFS BY MOUTH EVERY 6 HOURS ASNEEDED WHEEZING   budesonide-formoterol (SYMBICORT) 80-4.5 MCG/ACT inhaler, Inhale into the lungs.      Reviewed prior external information including notes and imaging from  primary care provider As well as notes that were available from care everywhere and other healthcare systems.  Past medical history, social, surgical and family history all reviewed in electronic medical record.  No pertanent information unless stated regarding to the chief complaint.   Review of  Systems:  No headache, visual changes, nausea, vomiting, diarrhea, constipation, dizziness, abdominal pain, skin rash, fevers, chills, night sweats, weight loss, swollen lymph nodes, body aches, joint swelling, chest pain, shortness of breath, mood changes. POSITIVE muscle aches  Objective  Last menstrual period 05/10/2021.   General: No apparent distress alert and oriented x3 mood and affect normal, dressed appropriately.  HEENT: Pupils equal, extraocular movements intact  Respiratory: Patient's speak in full sentences and does not appear short of breath  Cardiovascular: No lower  extremity edema, non tender, no erythema  Gait normal with good balance and coordination.  MSK:  Non tender with full range of motion and good stability and symmetric strength and tone of shoulders, elbows, wrist, hip, knee and ankles bilaterally.     Impression and Recommendations:     The above documentation has been reviewed and is accurate and complete Rebekah [name] Rebekah [name]

## 2021-05-20 ENCOUNTER — Ambulatory Visit: Payer: Self-pay | Admitting: Family Medicine

## 2021-05-23 ENCOUNTER — Telehealth: Payer: Self-pay

## 2021-05-23 ENCOUNTER — Ambulatory Visit: Payer: BC Managed Care – PPO

## 2021-05-23 NOTE — Telephone Encounter (Signed)
No show. Called pt who said that she meant to call earlier and cancel today and Wednesday because she broke her R pinky toe and can't drive. Will try to come to her Monday 05/30/21 appointment.

## 2021-05-25 ENCOUNTER — Ambulatory Visit: Payer: BC Managed Care – PPO

## 2021-05-30 ENCOUNTER — Ambulatory Visit: Payer: BC Managed Care – PPO | Attending: Family Medicine

## 2021-06-01 ENCOUNTER — Ambulatory Visit: Payer: BC Managed Care – PPO

## 2021-06-06 ENCOUNTER — Ambulatory Visit: Payer: BC Managed Care – PPO

## 2021-06-08 ENCOUNTER — Ambulatory Visit: Payer: BC Managed Care – PPO

## 2021-06-13 ENCOUNTER — Ambulatory Visit: Payer: BC Managed Care – PPO

## 2021-06-15 ENCOUNTER — Ambulatory Visit: Payer: BC Managed Care – PPO

## 2021-06-16 NOTE — Progress Notes (Signed)
[name] [address] [place] [address] Phone: [phone] Subjective:    I'm seeing this patient by the request  of:  [name], [name], MD  CC: R 5th toe fx  I, Christoper Fabian, LAT, ATC, am serving as scribe for Dr. Antoine Primas.  [id number]  Rebekah [name] is a 30 y.o. female coming in with complaint of R 5th toe fx. Patient was seen at the [facility] on 05/17/2021 after stubbing her toe on a box on 05/16/21.  She had XR that revealed a proximal phalanx fx of the R 5t toe.  Today, pt states that her R pinky toe con't to be painful.  She has been wearing a post-op shoe.  Her pain is located to her R 5th toe that radiates into her R lateral foot.  She con't to have some bruising and swelling.  Xray R foot 05/17/2021 FINDINGS: There is a largely transverse fracture involving the proximal shaft of the proximal phalanx of the fifth toe. Slight angulation but no significant displacement. No intra-articular involvement.   The other bony structures are intact. No other fractures are identified. Small calcaneal heel spur noted.   IMPRESSION: Fifth proximal phalanx fracture as detailed above.       No past medical history on file. Past Surgical History:  Procedure Laterality Date   WISDOM TOOTH EXTRACTION     Social History   Socioeconomic History   Marital status: Married    Spouse name: Not on file   Number of children: Not on file   Years of education: Not on file   Highest education level: Not on file  Occupational History   Not on file  Tobacco Use   Smoking status: Never   Smokeless tobacco: Never  Vaping Use   Vaping Use: Never used  Substance and Sexual Activity   Alcohol use: Yes   Drug use: Not Currently   Sexual activity: Not on file  Other Topics Concern   Not on file  Social History Narrative   ** Merged History Encounter **       Social Determinants of Health   Financial Resource Strain: Not on  file  Food Insecurity: Not on file  Transportation Needs: Not on file  Physical Activity: Not on file  Stress: Not on file  Social Connections: Not on file   Allergies  Allergen Reactions   Sumatriptan Succinate Other (See Comments)    Muscle spasms   No family history on file.    Current Outpatient Medications (Respiratory):    albuterol (VENTOLIN HFA) 108 (90 Base) MCG/ACT inhaler, INHALE 2 PUFFS BY MOUTH EVERY 6 HOURS ASNEEDED WHEEZING   budesonide-formoterol (SYMBICORT) 80-4.5 MCG/ACT inhaler, Inhale into the lungs.     Reviewed prior external information including notes and imaging from  primary care provider As well as notes that were available from care everywhere and other healthcare systems.  Past medical history, social, surgical and family history all reviewed in electronic medical record.  No pertanent information unless stated regarding to the chief complaint.   Review of Systems:  No headache, visual changes, nausea, vomiting, diarrhea, constipation, dizziness, abdominal pain, skin rash, fevers, chills, night sweats, weight loss, swollen lymph nodes, body aches, joint swelling, chest pain, shortness of breath, mood changes. POSITIVE muscle aches  Objective  Blood pressure 122/80, pulse 78, height 5\' 1"  (1.549 m), weight 185 lb (83.9 kg), last menstrual period 06/07/2021, SpO2 100 %.   General: No apparent  distress alert and oriented x3 mood and affect normal, dressed appropriately.  HEENT: Pupils equal, extraocular movements intact  Respiratory: Patient's speak in full sentences and does not appear short of breath  Cardiovascular: No lower extremity edema, non tender, no erythema  Gait n antalgic gait MSK: Patient's right foot shows the patient's small toe has very mild angulation noted.  No significant swelling.  Severely tender to palpation even to light palpation.  Pain seems to be out of proportion to the amount of palpation.   Limited muscular skeletal  ultrasound was performed and interpreted by Antoine Primas, M  Limited ultrasound shows the patient does have a transverse fracture with good callus formation.  Still seems to be more of a soft callus turning into more of a hard callus.  It does seem to be bridged the entire contracture at the moment.  Mild surrounding hypoechoic changes.  No abnormal vascularity. Impression: Transverse fracture of the fifth metatarsal with interval healing from previous x-rays   Impression and Recommendations:     The above documentation has been reviewed and is accurate and complete Judi Saa, DO

## 2021-06-17 ENCOUNTER — Ambulatory Visit (INDEPENDENT_AMBULATORY_CARE_PROVIDER_SITE_OTHER): Payer: BC Managed Care – PPO

## 2021-06-17 ENCOUNTER — Other Ambulatory Visit: Payer: Self-pay

## 2021-06-17 ENCOUNTER — Ambulatory Visit (INDEPENDENT_AMBULATORY_CARE_PROVIDER_SITE_OTHER): Payer: BC Managed Care – PPO | Admitting: Family Medicine

## 2021-06-17 ENCOUNTER — Encounter: Payer: Self-pay | Admitting: Family Medicine

## 2021-06-17 ENCOUNTER — Ambulatory Visit: Payer: Self-pay

## 2021-06-17 VITALS — BP 122/80 | HR 78 | Ht 61.0 in | Wt 185.0 lb

## 2021-06-17 DIAGNOSIS — S92501A Displaced unspecified fracture of right lesser toe(s), initial encounter for closed fracture: Secondary | ICD-10-CM | POA: Diagnosis not present

## 2021-06-17 DIAGNOSIS — M79671 Pain in right foot: Secondary | ICD-10-CM | POA: Diagnosis not present

## 2021-06-17 IMAGING — DX DG FOOT 2V*R*
2 series · 2 of 2 positions shown · non-contrast
Comparison: Foot radiograph [DATE]

CLINICAL DATA: Right foot pain. Lateral pain radiating to the third
through fifth metatarsal phalangeal joints. Bruising. Symptoms for 1
month after dropping heavy object when moving.

EXAM:
RIGHT FOOT - 2 VIEW

[foot ap]
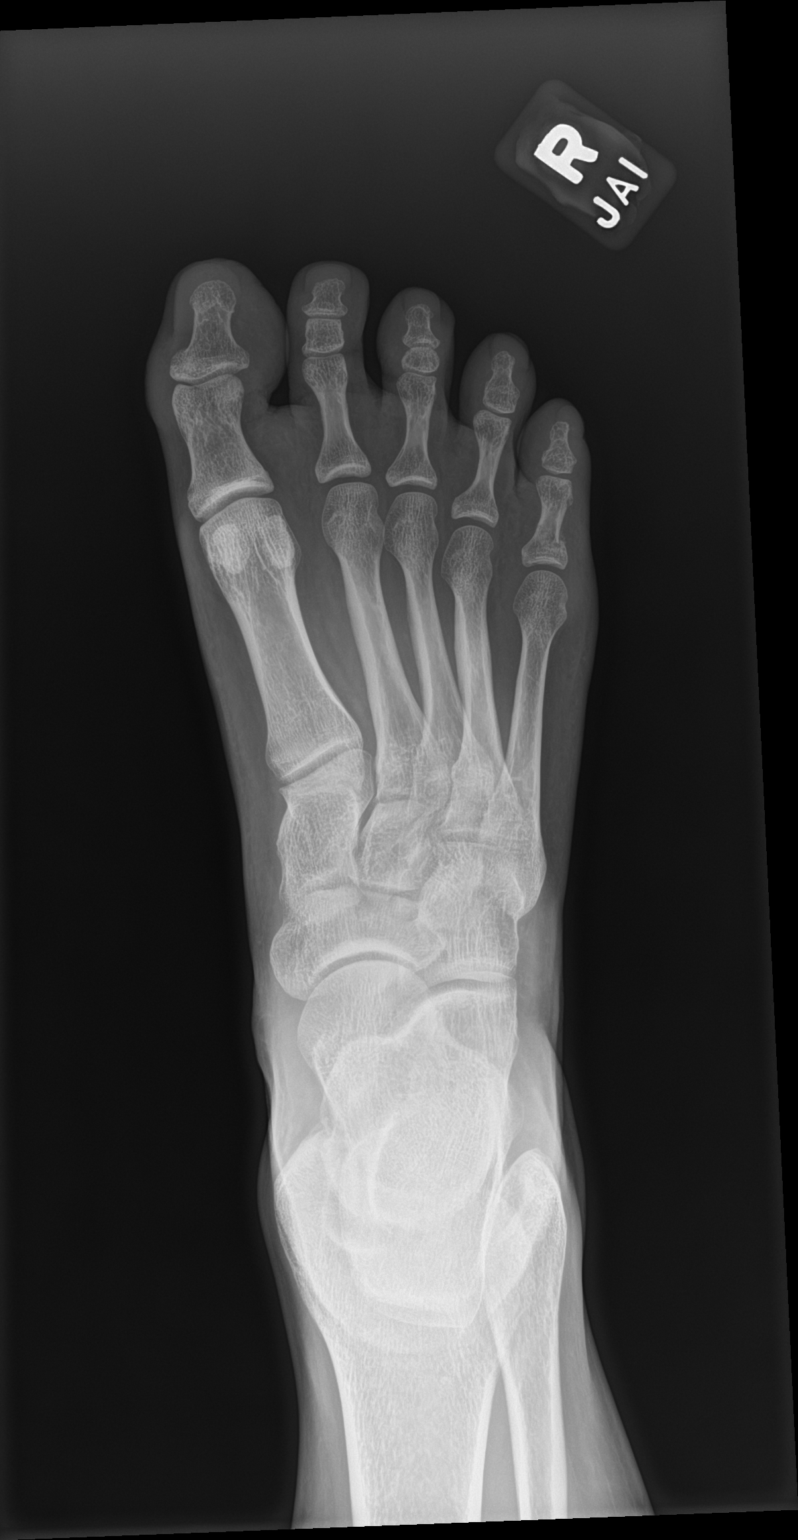

[foot lat]
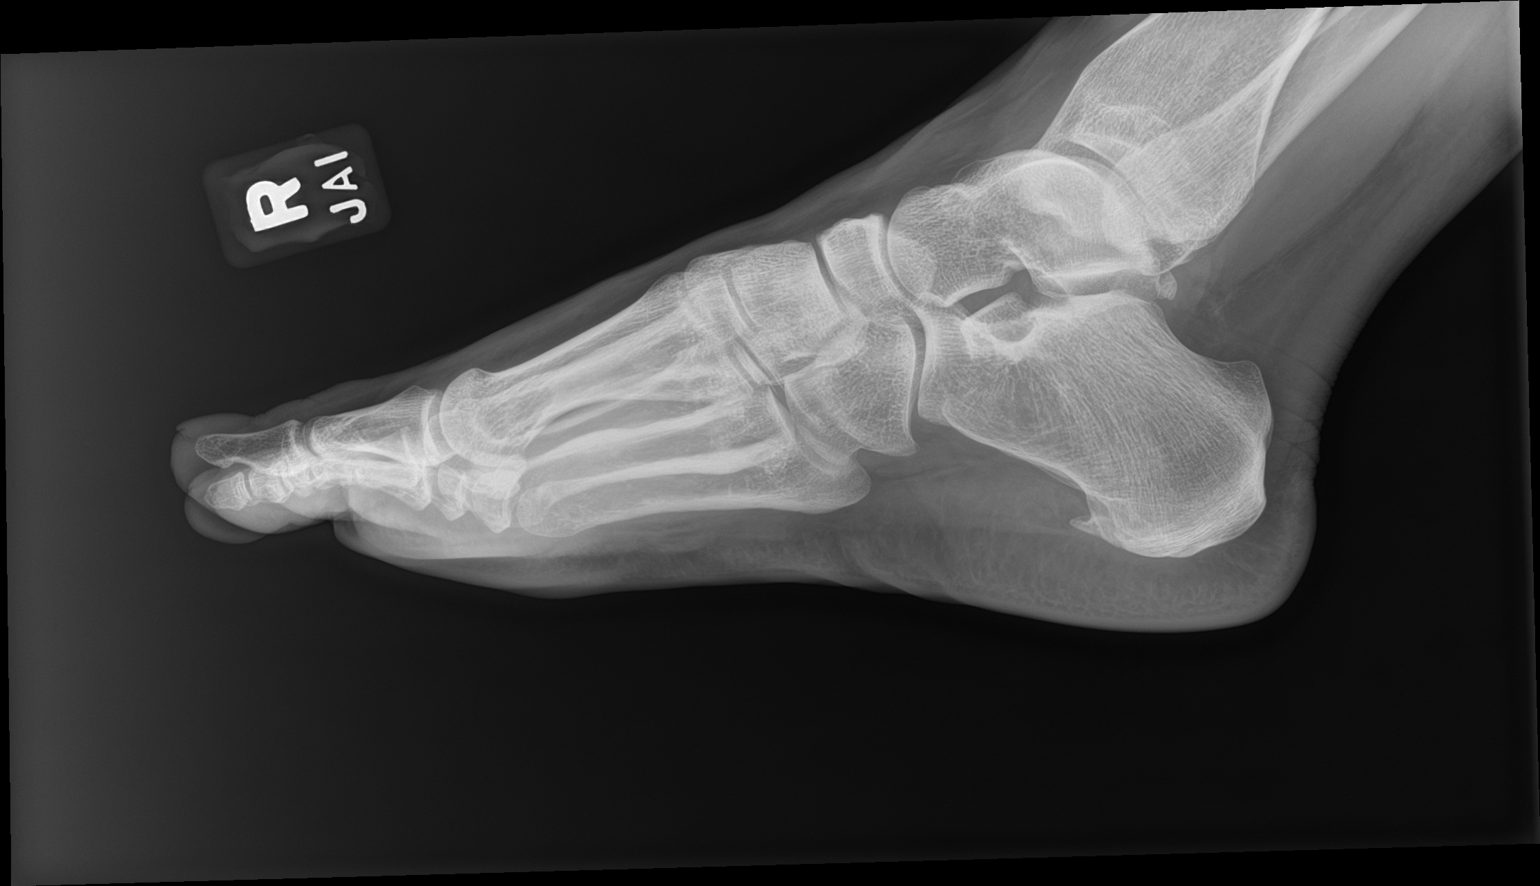

[2 of 2 positions shown; findings below may reference images not displayed]

FINDINGS: The previous fifth toe proximal phalanx fracture has healed. There
is bridging callus, minimal residual irregularity about the lateral
cortex. No additional fracture of the foot. No new fracture.
Metatarsals and metatarsal-phalangeal joints are intact. There is a
plantar calcaneal spur. Normal alignment. No significant
arthropathy. Minimal lateral soft tissue edema.
IMPRESSION: 1. Healed fifth toe proximal phalanx fracture. No new fracture or
acute osseous findings.
2. Plantar calcaneal spur.

## 2021-06-17 MED ORDER — VITAMIN D (ERGOCALCIFEROL) 1.25 MG (50000 UNIT) PO CAPS: 50000.0000 [IU] | ORAL_CAPSULE | ORAL | 0 refills | Status: DC

## 2021-06-17 NOTE — Assessment & Plan Note (Signed)
Patient did have more of the fifth toe fracture.  Patient did have a transverse and could have been potentially through both cortices.  Patient continues to have pain.  On ultrasound it appeared the patient did have good callus formation noted.  X-ray today shows the patient does have potentially mild shortening noted of the phalanx but no significant angulation.  Patient given a cam walker for more benefit but we also discussed the possibility of a carbon fiber plate that I think will be helpful.  Discussed avoiding being barefoot still.  Patient has had a history of a neuroma previously as well and we will need to continue to monitor.  Patient given a once weekly vitamin D to help with supplementation for the next 2 months.  Patient will discontinue her daily dosing that she is doing only intermittently.  Follow-up with me again 4 weeks

## 2021-06-17 NOTE — Patient Instructions (Addendum)
Good to see you.   You can stop buddy-taping your toes together and stop using the post op shoe.  Instead, please get a carbon fiber plate insert for your shoe which is typically used for turf toe.   [email or web address]  Follow-up in about 4 weeks.

## 2021-06-20 ENCOUNTER — Telehealth: Payer: Self-pay

## 2021-06-20 ENCOUNTER — Ambulatory Visit: Payer: BC Managed Care – PPO

## 2021-06-20 NOTE — Telephone Encounter (Signed)
Attempted to call patient to let her know that she was billed for 2 braces because she received a brace from [facility] and then from [place]. Patient will need to submit payment for both.

## 2021-06-22 ENCOUNTER — Ambulatory Visit: Payer: BC Managed Care – PPO

## 2021-06-27 ENCOUNTER — Ambulatory Visit: Payer: BC Managed Care – PPO

## 2021-06-29 ENCOUNTER — Ambulatory Visit: Payer: BC Managed Care – PPO

## 2021-07-06 ENCOUNTER — Ambulatory Visit: Payer: BC Managed Care – PPO

## 2021-07-20 NOTE — Progress Notes (Signed)
[name] [address] [place] [address] Phone: [phone] Subjective:   [name], am serving as a scribe for Dr. Antoine Primas. This visit occurred during the SARS-CoV-2 public health emergency.  Safety protocols were in place, including screening questions prior to the visit, additional usage of staff PPE, and extensive cleaning of exam room while observing appropriate contact time as indicated for disinfecting solutions.   I'm seeing this patient by the request  of:  [name], [name], MD  CC: Foot pain, shoulder pain  [id number]  06/17/2021 Patient did have more of the fifth toe fracture.  Patient did have a transverse and could have been potentially through both cortices.  Patient continues to have pain.  On ultrasound it appeared the patient did have good callus formation noted.  X-ray today shows the patient does have potentially mild shortening noted of the phalanx but no significant angulation.  Patient given a cam walker for more benefit but we also discussed the possibility of a carbon fiber plate that I think will be helpful.  Discussed avoiding being barefoot still.  Patient has had a history of a neuroma previously as well and we will need to continue to monitor.  Patient given a once weekly vitamin D to help with supplementation for the next 2 months.  Patient will discontinue her daily dosing that she is doing only intermittently.  Follow-up with me again 4 weeks  Update 07/21/2021 [name] Samona Chihuahua is a 29 y.o. female coming in with complaint of R fifth toe fx. Patient states that she still has pain in foot but improving.  Patient states that she has been able to work out a little bit.  Also having L shoulder pain with IR for one week after doing pullover exercise. Pain over superior aspect.  Patient states that it is somewhat discomfortable with certain range of motion.  Does not notice any significant weakness.   Symptoms can be uncomfortable at night.     No past medical history on file. Past Surgical History:  Procedure Laterality Date   WISDOM TOOTH EXTRACTION     Social History   Socioeconomic History   Marital status: Married    Spouse name: Not on file   Number of children: Not on file   Years of education: Not on file   Highest education level: Not on file  Occupational History   Not on file  Tobacco Use   Smoking status: Never   Smokeless tobacco: Never  Vaping Use   Vaping Use: Never used  Substance and Sexual Activity   Alcohol use: Yes   Drug use: Not Currently   Sexual activity: Not on file  Other Topics Concern   Not on file  Social History Narrative   ** Merged History Encounter **       Social Determinants of Health   Financial Resource Strain: Not on file  Food Insecurity: Not on file  Transportation Needs: Not on file  Physical Activity: Not on file  Stress: Not on file  Social Connections: Not on file   Allergies  Allergen Reactions   Sumatriptan Succinate Other (See Comments)    Muscle spasms   No family history on file.    Current Outpatient Medications (Respiratory):    albuterol (VENTOLIN HFA) 108 (90 Base) MCG/ACT inhaler, INHALE 2 PUFFS BY MOUTH EVERY 6 HOURS ASNEEDED WHEEZING   budesonide-formoterol (SYMBICORT) 80-4.5 MCG/ACT inhaler, Inhale into the lungs.    Current Outpatient  Medications (Other):    Vitamin D, Ergocalciferol, (DRISDOL) 1.25 MG (50000 UNIT) CAPS capsule, Take 1 capsule (50,000 Units total) by mouth every 7 (seven) days.   Reviewed prior external information including notes and imaging from  primary care provider As well as notes that were available from care everywhere and other healthcare systems.  Past medical history, social, surgical and family history all reviewed in electronic medical record.  No pertanent information unless stated regarding to the chief complaint.   Review of Systems:  No headache, visual  changes, nausea, vomiting, diarrhea, constipation, dizziness, abdominal pain, skin rash, fevers, chills, night sweats, weight loss, swollen lymph nodes, body aches, joint swelling, chest pain, shortness of breath, mood changes. POSITIVE muscle aches  Objective  Blood pressure 110/80, pulse 72, height 5\' 1"  (1.549 m), weight 188 lb (85.3 kg), SpO2 99 %.   General: No apparent distress alert and oriented x3 mood and affect normal, dressed appropriately.  HEENT: Pupils equal, extraocular movements intact  Respiratory: Patient's speak in full sentences and does not appear short of breath  Cardiovascular: No lower extremity edema, non tender, no erythema  Gait normal with good balance and coordination.  MSK: Right foot exam shows the patient does have good range of motion noted.  Still tenderness to palpation over the fifth toe.  No cortical irregularities are noted can be felt.  Patient does have some voluntary guarding noted.  Left shoulder exam does have positive impingement signs.  Rotator cuff strength is intact.  Shoulder: left Inspection reveals no abnormalities, atrophy or asymmetry. Palpation is normal with no tenderness over AC joint or bicipital groove. ROM is full in all planes passively. Rotator cuff strength normal throughout. signs of impingement with positive Neer and Hawkin's tests, but negative empty can sign. Speeds and Yergason's tests normal. No labral pathology noted with negative Obrien's, negative clunk and good stability. Normal scapular function observed. No painful arc and no drop arm sign. No apprehension sign  MSK performed of: left This study was ordered, performed, and interpreted by [place] D.O.  Shoulder:   Supraspinatus:  Appears normal on long and transverse views, Bursal bulge seen with shoulder abduction on impingement view. Biceps Tendon:  Appears normal on long and transverse views, no fraying of tendon, tendon located in intertubercular groove,  no subluxation with shoulder internal or external rotation.  Impression: Subacromial bursitis  97110; 15 additional minutes spent for Therapeutic exercises as stated in above notes.  This included exercises focusing on stretching, strengthening, with significant focus on eccentric aspects.   Long term goals include an improvement in range of motion, strength, endurance as well as avoiding reinjury. Patient's frequency would include in 1-2 times a day, 3-5 times a week for a duration of 6-12 weeks. Shoulder Exercises that included:  Basic scapular stabilization to include adduction and depression of scapula Scaption, focusing on proper movement and good control Internal and External rotation utilizing a theraband, with elbow tucked at side entire time Rows with theraband    Proper technique shown and discussed handout in great detail with ATC.  All questions were discussed and answered.     Impression and Recommendations:     The above documentation has been reviewed and is accurate and complete Terrilee Files, DO

## 2021-07-21 ENCOUNTER — Encounter: Payer: Self-pay | Admitting: Family Medicine

## 2021-07-21 ENCOUNTER — Ambulatory Visit (INDEPENDENT_AMBULATORY_CARE_PROVIDER_SITE_OTHER): Payer: BC Managed Care – PPO | Admitting: Family Medicine

## 2021-07-21 ENCOUNTER — Other Ambulatory Visit: Payer: Self-pay

## 2021-07-21 ENCOUNTER — Ambulatory Visit: Payer: Self-pay

## 2021-07-21 VITALS — BP 110/80 | HR 72 | Ht 61.0 in | Wt 188.0 lb

## 2021-07-21 DIAGNOSIS — S92501A Displaced unspecified fracture of right lesser toe(s), initial encounter for closed fracture: Secondary | ICD-10-CM | POA: Diagnosis not present

## 2021-07-21 DIAGNOSIS — M7552 Bursitis of left shoulder: Secondary | ICD-10-CM

## 2021-07-21 NOTE — Assessment & Plan Note (Signed)
Patient is fully healed at this time he needs to increase activity.  Patient does have more tightness and on ultrasound does appear that patient does have hypoechoic changes that is consistent with more of a tendinitis.  Patient will increase activity and follow-up with me again in 6 to 8 weeks.

## 2021-07-21 NOTE — Assessment & Plan Note (Signed)
Acute shoulder bursitis is noted on ultrasound.  Discussed icing regimen and home exercises, discussed which activities to do which wants to avoid.  Increase activity slowly.  Follow-up with me again in 6 weeks.  If worsening pain consider formal physical therapy as well as injection.

## 2021-07-21 NOTE — Patient Instructions (Signed)
Shoulder bursitis Ice after activity Hands in peripheral vision See me in 6 weeks can injection if not better

## 2021-08-22 NOTE — Progress Notes (Signed)
[name] [address] [place] [address] Phone: [phone] Subjective:   Rebekah [name], am serving as a scribe for Dr. Antoine Primas. This visit occurred during the SARS-CoV-2 public health emergency.  Safety protocols were in place, including screening questions prior to the visit, additional usage of staff PPE, and extensive cleaning of exam room while observing appropriate contact time as indicated for disinfecting solutions.   I'm seeing this patient by the request  of:  [name], [name], MD  CC: Left shoulder pain follow-up  AQT:[id number]  07/21/2021 Patient is fully healed at this time he needs to increase activity.  Patient does have more tightness and on ultrasound does appear that patient does have hypoechoic changes that is consistent with more of a tendinitis.  Patient will increase activity and follow-up with me again in 6 to 8 weeks.  Patient is fully healed at this time he needs to increase activity.  Patient does have more tightness and on ultrasound does appear that patient does have hypoechoic changes that is consistent with more of a tendinitis.  Patient will increase activity and follow-up with me again in 6 to 8 weeks.  Update 08/23/2021 [name] Yukie Bergeron is a 30 y.o. female coming in with complaint of L shoulder and R 5th toe fx. Patient states that she has not been doing much cardio. No pain with lifting. Notes feeling strain in L ankle from previous injury.   Continued L shoulder pain over superior aspect. Has numbness going into 5th finger L hand.  Patient states that it is still becoming tedious with certain movements.  Can still wake her up at night.  Describes the pain as a dull, throbbing aching sensation.  Sometimes a sharp pain with certain range of motion.      No past medical history on file. Past Surgical History:  Procedure Laterality Date   WISDOM TOOTH EXTRACTION     Social History    Socioeconomic History   Marital status: Married    Spouse name: Not on file   Number of children: Not on file   Years of education: Not on file   Highest education level: Not on file  Occupational History   Not on file  Tobacco Use   Smoking status: Never   Smokeless tobacco: Never  Vaping Use   Vaping Use: Never used  Substance and Sexual Activity   Alcohol use: Yes   Drug use: Not Currently   Sexual activity: Not on file  Other Topics Concern   Not on file  Social History Narrative   ** Merged History Encounter **       Social Determinants of Health   Financial Resource Strain: Not on file  Food Insecurity: Not on file  Transportation Needs: Not on file  Physical Activity: Not on file  Stress: Not on file  Social Connections: Not on file   Allergies  Allergen Reactions   Sumatriptan Succinate Other (See Comments)    Muscle spasms   No family history on file.    Current Outpatient Medications (Respiratory):    albuterol (VENTOLIN HFA) 108 (90 Base) MCG/ACT inhaler, INHALE 2 PUFFS BY MOUTH EVERY 6 HOURS ASNEEDED WHEEZING   budesonide-formoterol (SYMBICORT) 80-4.5 MCG/ACT inhaler, Inhale into the lungs.    Current Outpatient Medications (Other):    Vitamin D, Ergocalciferol, (DRISDOL) 1.25 MG (50000 UNIT) CAPS capsule, Take 1 capsule (50,000 Units total) by mouth every 7 (seven) days.   Reviewed prior external  information including notes and imaging from  primary care provider As well as notes that were available from care everywhere and other healthcare systems.  Past medical history, social, surgical and family history all reviewed in electronic medical record.  No pertanent information unless stated regarding to the chief complaint.   Review of Systems:  No headache, visual changes, nausea, vomiting, diarrhea, constipation, dizziness, abdominal pain, skin rash, fevers, chills, night sweats, weight loss, swollen lymph nodes, body aches, joint swelling,  chest pain, shortness of breath, mood changes. POSITIVE muscle aches  Objective  Blood pressure 122/84, pulse 90, height 5\' 1"  (1.549 m), weight 188 lb (85.3 kg), SpO2 97 %.   General: No apparent distress alert and oriented x3 mood and affect normal, dressed appropriately.  HEENT: Pupils equal, extraocular movements intact  Respiratory: Patient's speak in full sentences and does not appear short of breath  Cardiovascular: No lower extremity edema, non tender, no erythema  Gait normal with good balance and coordination.  MSK: Shoulder: left Inspection reveals no abnormalities, atrophy or asymmetry. Palpation is normal with no tenderness over AC joint or bicipital groove. ROM is full in all planes passively. Rotator cuff strength normal throughout. signs of impingement with positive Neer and Hawkin's tests, but negative empty can sign. Speeds and Yergason's tests normal. No labral pathology noted with negative Obrien's, negative clunk and good stability. Normal scapular function observed. No painful arc and no drop arm sign. No apprehension sign  MSK performed of: left This study was ordered, performed, and interpreted by [place] D.O.  Shoulder:   Supraspinatus:  Appears normal on long and transverse views, Bursal bulge seen with shoulder abduction on impingement view. Subscapularis:  Appears normal on long and transverse views. Positive bursa AC joint:  Capsule distention noted Glenohumeral Joint:  Appears normal without effusion. Glenoid Labrum:  Intact without visualized tears. Biceps Tendon:  Appears normal on long and transverse views, no fraying of tendon, tendon located in intertubercular groove, no subluxation with shoulder internal or external rotation.  Impression: Subacromial bursitis with acromioclavicular swelling as well  Procedure: Real-time Ultrasound Guided Injection of left glenohumeral joint Device: GE Logiq E  Ultrasound guided injection is preferred  based studies that show increased duration, increased effect, greater accuracy, decreased procedural pain, increased response rate with ultrasound guided versus blind injection.  Verbal informed consent obtained.  Time-out conducted.  Noted no overlying erythema, induration, or other signs of local infection.  Skin prepped in a sterile fashion.  Local anesthesia: Topical Ethyl chloride.  With sterile technique and under real time ultrasound guidance:  Joint visualized.  23g 1  inch needle inserted posterior approach. Pictures taken for needle placement. Patient did have injection of 2 cc of 1% lidocaine, 2 cc of 0.5% Marcaine, and 1.0 cc of Kenalog 40 mg/dL. Completed without difficulty  Pain immediately resolved suggesting accurate placement of the medication.  Advised to call if fevers/chills, erythema, induration, drainage, or persistent bleeding.  Images permanently stored and available for review in the ultrasound unit.  Impression: Technically successful ultrasound guided injection.  Procedure: Real-time Ultrasound Guided Injection of left acromioclavicular joint Device: GE Logiq Q7 Ultrasound guided injection is preferred based studies that show increased duration, increased effect, greater accuracy, decreased procedural pain, increased response rate, and decreased cost with ultrasound guided versus blind injection.  Verbal informed consent obtained.  Time-out conducted.  Noted no overlying erythema, induration, or other signs of local infection.  Skin prepped in a sterile fashion.  Local anesthesia: Topical  Ethyl chloride.  With sterile technique and under real time ultrasound guidance: With a 25-gauge half inch needle injecting 0.5 cc of 0.5% Marcaine and 0.5 cc of Kenalog 40 mg/mL Completed without difficulty  Pain immediately resolved suggesting accurate placement of the medication.  Advised to call if fevers/chills, erythema, induration, drainage, or persistent bleeding.   Impression: Technically successful ultrasound guided injection.    Impression and Recommendations:     The above documentation has been reviewed and is accurate and complete Judi Saa, DO

## 2021-08-23 ENCOUNTER — Encounter: Payer: Self-pay | Admitting: Family Medicine

## 2021-08-23 ENCOUNTER — Other Ambulatory Visit: Payer: Self-pay

## 2021-08-23 ENCOUNTER — Ambulatory Visit: Payer: Self-pay

## 2021-08-23 ENCOUNTER — Ambulatory Visit (INDEPENDENT_AMBULATORY_CARE_PROVIDER_SITE_OTHER): Payer: BC Managed Care – PPO | Admitting: Family Medicine

## 2021-08-23 VITALS — BP 122/84 | HR 90 | Ht 61.0 in | Wt 188.0 lb

## 2021-08-23 DIAGNOSIS — M25512 Pain in left shoulder: Secondary | ICD-10-CM | POA: Diagnosis not present

## 2021-08-23 DIAGNOSIS — M7552 Bursitis of left shoulder: Secondary | ICD-10-CM | POA: Diagnosis not present

## 2021-08-23 DIAGNOSIS — M25519 Pain in unspecified shoulder: Secondary | ICD-10-CM | POA: Insufficient documentation

## 2021-08-23 DIAGNOSIS — M79671 Pain in right foot: Secondary | ICD-10-CM | POA: Diagnosis not present

## 2021-08-23 NOTE — Patient Instructions (Signed)
Good to see you  Ice 20 minutes 2 times daily. Usually after activity and before bed. Injected shoulder and I hoping you will continue to improve.  See me again in 6 weeks to make sure doing well otherwise we may need to consider MRI

## 2021-08-23 NOTE — Assessment & Plan Note (Signed)
Patient responded very well to the injection but not completely.  Seems to be more of the bursitis giving her more difficulty.  Discussed with patient about home exercises and icing regimen.  Continue to limit certain range of motion.  Follow-up with me again in 6 weeks

## 2021-08-23 NOTE — Assessment & Plan Note (Signed)
Patient given injection and tolerated the procedure well.  Discussed icing regimen and home exercises.  Discussed which activities to do which wants to avoid.  Patient had improvement in range of motion and nearly completely resolved after this injection compared to the acromioclavicular injection which was given right before this.  Encourage patient to make some increasing movement.  If having continued difficulty I do feel advanced imaging will be warranted at follow-up in 6 weeks

## 2021-10-04 NOTE — Progress Notes (Signed)
[name] [address] [place] [address] Phone: [phone] Subjective:   Rebekah [name], am serving as a scribe for Dr. Antoine Primas.  This visit occurred during the SARS-CoV-2 public health emergency.  Safety protocols were in place, including screening questions prior to the visit, additional usage of staff PPE, and extensive cleaning of exam room while observing appropriate contact time as indicated for disinfecting solutions.    I'm seeing this patient by the request  of:  [name], [name], MD  CC: Back pain follow-up  OIN:[id number]  08/23/2021 Patient responded very well to the injection but not completely.  Seems to be more of the bursitis giving her more difficulty.  Discussed with patient about home exercises and icing regimen.  Continue to limit certain range of motion.  Follow-up with me again in 6 weeks  Patient given injection and tolerated the procedure well.  Discussed icing regimen and home exercises.  Discussed which activities to do which wants to avoid.  Patient had improvement in range of motion and nearly completely resolved after this injection compared to the acromioclavicular injection which was given right before this.  Encourage patient to make some increasing movement.  If having continued difficulty I do feel advanced imaging will be warranted at follow-up in 6 weeks  Update 10/05/2021 Rebekah [name] is a 30 y.o. female coming in with complaint of L shoulder pain. Patient states that her L shoulder has been doing ok as she is not working out.   Having lower back pain for years. Pain will radiate into the glutes. CT recently for kidney stones that shows arthritis in her back. Lumbar flexion increases her pain. Having more pain in L side of back due to kidney stone that has not passed.  Patient does not know if it is secondary to the muscle pain or to the kidney stone itself.  Patient states it does seem to hurt  more when she does movement.  Patient denies any fevers, chills, any blood in her urine. Reviewed patient's labs and did find the patient did have hypercalcemia of 10.3.  We will      No past medical history on file. Past Surgical History:  Procedure Laterality Date   WISDOM TOOTH EXTRACTION     Social History   Socioeconomic History   Marital status: Married    Spouse name: Not on file   Number of children: Not on file   Years of education: Not on file   Highest education level: Not on file  Occupational History   Not on file  Tobacco Use   Smoking status: Never   Smokeless tobacco: Never  Vaping Use   Vaping Use: Never used  Substance and Sexual Activity   Alcohol use: Yes   Drug use: Not Currently   Sexual activity: Not on file  Other Topics Concern   Not on file  Social History Narrative   ** Merged History Encounter **       Social Determinants of Health   Financial Resource Strain: Not on file  Food Insecurity: Not on file  Transportation Needs: Not on file  Physical Activity: Not on file  Stress: Not on file  Social Connections: Not on file   Allergies  Allergen Reactions   Sumatriptan Succinate Other (See Comments)    Muscle spasms   No family history on file.  Current Outpatient Medications (Endocrine & Metabolic):    predniSONE (DELTASONE) 20 MG tablet, Take 1  tablet (20 mg total) by mouth daily with breakfast.   Current Outpatient Medications (Respiratory):    albuterol (VENTOLIN HFA) 108 (90 Base) MCG/ACT inhaler, INHALE 2 PUFFS BY MOUTH EVERY 6 HOURS ASNEEDED WHEEZING   budesonide-formoterol (SYMBICORT) 80-4.5 MCG/ACT inhaler, Inhale into the lungs.  Current Outpatient Medications (Analgesics):    HYDROcodone-acetaminophen (NORCO) 10-325 MG tablet, Take 1 tablet by mouth every 6 (six) hours as needed.   Current Outpatient Medications (Other):    tiZANidine (ZANAFLEX) 2 MG tablet, Take 1 tablet (2 mg total) by mouth at bedtime.   Vitamin  D, Ergocalciferol, (DRISDOL) 1.25 MG (50000 UNIT) CAPS capsule, Take 1 capsule (50,000 Units total) by mouth every 7 (seven) days.   Reviewed prior external information including notes and imaging from  primary care provider As well as notes that were available from care everywhere and other healthcare systems.  Past medical history, social, surgical and family history all reviewed in electronic medical record.  No pertanent information unless stated regarding to the chief complaint.   Review of Systems:  No headache, visual changes, nausea, vomiting, diarrhea, constipation, dizziness, abdominal pain, skin rash, fevers, chills, night sweats, weight loss, swollen lymph nodes,  joint swelling, chest pain, shortness of breath, mood changes. POSITIVE muscle aches, body aches  Objective  Blood pressure 116/72, pulse 78, height 5\' 1"  (1.549 m), weight 181 lb (82.1 kg), SpO2 99 %.   General: No apparent distress alert and oriented x3 mood and affect normal, dressed appropriately.  HEENT: Pupils equal, extraocular movements intact  Respiratory: Patient's speak in full sentences and does not appear short of breath  Cardiovascular: No lower extremity edema, non tender, no erythema  Gait normal with good balance and coordination.  MSK: Low back exam does have loss of lordosis.  Patient has very mild CVA tenderness noted on the left side.  More tenderness over the sacroiliac joint.  Patient does have mild positive straight leg test at 25 degrees of forward flexion.  Patient has significant voluntary guarding noted.  Neurovascularly intact distally.    Impression and Recommendations:     The above documentation has been reviewed and is accurate and complete , DO

## 2021-10-05 ENCOUNTER — Encounter: Payer: Self-pay | Admitting: Family Medicine

## 2021-10-05 ENCOUNTER — Ambulatory Visit (INDEPENDENT_AMBULATORY_CARE_PROVIDER_SITE_OTHER): Payer: BC Managed Care – PPO | Admitting: Family Medicine

## 2021-10-05 ENCOUNTER — Other Ambulatory Visit: Payer: Self-pay

## 2021-10-05 ENCOUNTER — Ambulatory Visit: Payer: Self-pay

## 2021-10-05 DIAGNOSIS — M5442 Lumbago with sciatica, left side: Secondary | ICD-10-CM | POA: Diagnosis not present

## 2021-10-05 DIAGNOSIS — M545 Low back pain, unspecified: Secondary | ICD-10-CM | POA: Insufficient documentation

## 2021-10-05 DIAGNOSIS — M5416 Radiculopathy, lumbar region: Secondary | ICD-10-CM | POA: Diagnosis not present

## 2021-10-05 DIAGNOSIS — G8929 Other chronic pain: Secondary | ICD-10-CM | POA: Diagnosis not present

## 2021-10-05 DIAGNOSIS — M25512 Pain in left shoulder: Secondary | ICD-10-CM

## 2021-10-05 DIAGNOSIS — M5441 Lumbago with sciatica, right side: Secondary | ICD-10-CM

## 2021-10-05 MED ORDER — KETOROLAC TROMETHAMINE 30 MG/ML IJ SOLN
30.0000 mg | Freq: Once | INTRAMUSCULAR | Status: AC
  Administered 2021-10-05: 30 mg via INTRAMUSCULAR

## 2021-10-05 MED ORDER — METHYLPREDNISOLONE ACETATE 40 MG/ML IJ SUSP
40.0000 mg | Freq: Once | INTRAMUSCULAR | Status: AC
  Administered 2021-10-05: 40 mg via INTRAMUSCULAR

## 2021-10-05 MED ORDER — PREDNISONE 20 MG PO TABS: 20.0000 mg | ORAL_TABLET | Freq: Every day | ORAL | 0 refills | Status: DC

## 2021-10-05 MED ORDER — TIZANIDINE HCL 2 MG PO TABS: 2.0000 mg | ORAL_TABLET | Freq: Every day | ORAL | 0 refills | Status: DC

## 2021-10-05 NOTE — Patient Instructions (Addendum)
Decreased Vit D 2000IU daily Injections in backside today Start 20mg  Prednisone daily for 5 days-Start tmrw Do not use NSAIDS such as Advil or Aleve when taking Prednisone It is ok to use Tylenol for additional pain relief Endo referal Epi L3/L4 Zanaflex 2mg  at night If symptoms worsen, please seek medical care at the emergency room, especially fever See me in 4-6 weeks

## 2021-10-05 NOTE — Assessment & Plan Note (Signed)
  Rebekah [name] in nature and some tenderness noted.  Seems to be diffuse.  Did have recent kidney stone.  Has had pyelonephritis before but no fevers.  Discussed with patient to monitor for any signs and symptoms of any type of infectious etiology.  Toradol and Depo-Medrol given today and hopefully will be beneficial.  Short course of prednisone given as well.  Muscle relaxer to give at night.  Do believe with the hypercalcemia do need to consider with patient not taking any calcium supplementation possible further evaluation with endocrinology could be helpful with patient having a kidney stone in the calcium.  Patient on the imaging from outside facility does show a L3-L4 degenerative disc disease.  This is on the CT scan.  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  Seems to be more musculoskeletal with not as much of the urinary symptoms.  Follow-up with me again in 4 weeks and patient knows if worsening pain to seek medical attention

## 2021-10-06 ENCOUNTER — Encounter: Payer: Self-pay | Admitting: Family Medicine

## 2021-10-06 ENCOUNTER — Telehealth: Payer: Self-pay | Admitting: Family Medicine

## 2021-10-06 ENCOUNTER — Other Ambulatory Visit: Payer: Self-pay

## 2021-10-06 DIAGNOSIS — M5441 Lumbago with sciatica, right side: Secondary | ICD-10-CM

## 2021-10-06 NOTE — Telephone Encounter (Signed)
Order placed. Patient notified through MyChart.

## 2021-10-06 NOTE — Telephone Encounter (Signed)
Rebekah [name] with [facility] called stating that in order for them to do the epidural, she will need more imaging done.  They would generally request an MRI, but the patient would prefer a CT Scan. They asked that we place an order for a CT of the lumbar spine w/o contrast.  She is wanting this done tomorrow if possible so they are suggesting [facility] or [facility] in [place].

## 2021-10-06 NOTE — Telephone Encounter (Signed)
Would be CT myelogram

## 2021-10-07 ENCOUNTER — Encounter: Payer: Self-pay | Admitting: Family Medicine

## 2021-10-10 NOTE — Telephone Encounter (Signed)
I spoke to [employer] Victorino Dike at the [employer] location and [place] at the [place] location) on Friday.  [facility] [place] states they are not in network with [employer]. The patient told [facility] that she would go to another imaging center in [facility]). I tried to call [facility] to get more information so that insurance could be verified and to send over the order. There was no answer and I left a voicemail. They have not called back. We were not able to get verification from insurance without the location [facility]. I spoke to the patient who states they are in network because she has been there before. A detailed note was faxed to [facility] with the order and information that insurance had not been verified and that per the patient, they are in network on 10/07/2021 at 3:24pm.   I have not heard anything back.

## 2021-10-10 NOTE — Telephone Encounter (Signed)
noted 

## 2021-10-14 ENCOUNTER — Other Ambulatory Visit: Payer: Self-pay

## 2021-10-14 ENCOUNTER — Ambulatory Visit
Admission: RE | Admit: 2021-10-14 | Discharge: 2021-10-14 | Disposition: A | Discharge status: Home / Self Care | Payer: BC Managed Care – PPO | Source: Ambulatory Visit | Attending: Family Medicine | Admitting: Family Medicine

## 2021-10-14 DIAGNOSIS — M5416 Radiculopathy, lumbar region: Secondary | ICD-10-CM

## 2021-10-14 IMAGING — XA Imaging study
2 series · 2 of 2 positions shown · non-contrast
Comparison: none

CLINICAL DATA: Lumbosacral spondylosis without myelopathy. Patient
has right greater than left low back pain radiating into the legs.
She presents for right L3-L4 trans laminar epidural steroid
injection.

[Series 1: ortho standard · 1 of 1 slices shown (1 of 2)]
[im 1/1]
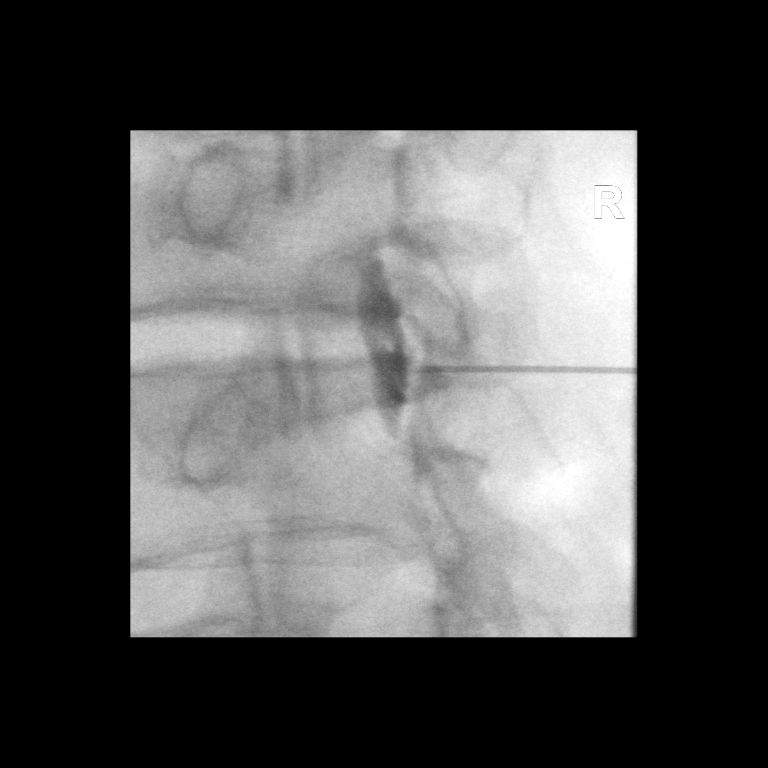

[Series 2: ortho standard · 1 of 1 slices shown (2 of 2)]
[im 1/1]
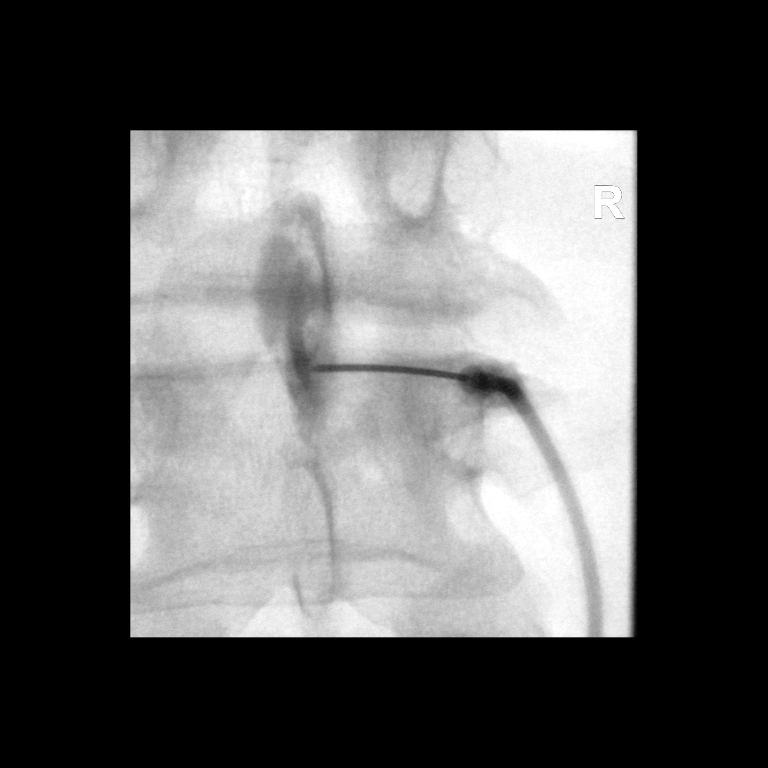

[2 of 2 positions shown; findings below may reference images not displayed]

FLUOROSCOPY TIME:  0 minutes 20 seconds 2.8 mGy

PROCEDURE:
The procedure, risks, benefits, and alternatives were explained to
the patient. Questions regarding the procedure were encouraged and
answered. The patient understands and consents to the procedure.

LUMBAR EPIDURAL INJECTION:

An interlaminar approach was performed on right at L3-L4. The
overlying skin was cleansed and anesthetized. A 20 gauge epidural
needle was advanced using loss-of-resistance technique.

DIAGNOSTIC EPIDURAL INJECTION:

Injection of Isovue-M 200 shows a good epidural pattern with spread
above and below the level of needle placement, primarily on the
right no vascular opacification is seen.

THERAPEUTIC EPIDURAL INJECTION:

40 Mg of Depo-Medrol mixed with 2 mL 1% lidocaine were instilled.
The procedure was well-tolerated, and the patient was discharged
thirty minutes following the injection in good condition.

COMPLICATIONS:
None
IMPRESSION: Technically successful epidural injection on the right L3-L4 #1.

## 2021-10-14 MED ORDER — IOPAMIDOL (ISOVUE-M 200) INJECTION 41%
1.0000 mL | Freq: Once | INTRAMUSCULAR | Status: AC
  Administered 2021-10-14: 1 mL via EPIDURAL

## 2021-10-14 MED ORDER — METHYLPREDNISOLONE ACETATE 40 MG/ML INJ SUSP (RADIOLOG
80.0000 mg | Freq: Once | INTRAMUSCULAR | Status: AC
  Administered 2021-10-14: 80 mg via EPIDURAL

## 2021-10-14 NOTE — Discharge Instructions (Signed)

## 2021-11-11 NOTE — Progress Notes (Signed)
[name] [address] [place] [address] Phone: [phone] Subjective:   [name], am serving as a scribe for Dr. Antoine Primas. This visit occurred during the SARS-CoV-2 public health emergency.  Safety protocols were in place, including screening questions prior to the visit, additional usage of staff PPE, and extensive cleaning of exam room while observing appropriate contact time as indicated for disinfecting solutions.   CC: Shoulder and back pain follow-up  [id number]  10/05/2021 Beverely Pace in nature and some tenderness noted.  Seems to be diffuse.  Did have recent kidney stone.  Has had pyelonephritis before but no fevers.  Discussed with patient to monitor for any signs and symptoms of any type of infectious etiology.  Toradol and Depo-Medrol given today and hopefully will be beneficial.  Short course of prednisone given as well.  Muscle relaxer to give at night.  Do believe with the hypercalcemia do need to consider with patient not taking any calcium supplementation possible further evaluation with endocrinology could be helpful with patient having a kidney stone in the calcium.  Patient on the imaging from outside facility does show a L3-L4 degenerative disc disease.  This is on the CT scan.  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  Seems to be more musculoskeletal with not as much of the urinary symptoms.  Follow-up with me again in 4 weeks and patient knows if worsening pain to seek medical attention  Update 11/14/2021 [name] Larinda Herter is a 31 y.o. female coming in with complaint of lumbar spine and L shoulder pain. Epidural  10/14/2021. Patient states shoulder is doing fine. Just started working out yesterday. Felt great after epidural. Pain from passing kidney stones. No other complaints. Patient has been feeling better overall. Increasing activity.  Patient still has the back pain that thinks is secondary to the kidney stones.      No past medical history on file. Past Surgical History:  Procedure Laterality Date   WISDOM TOOTH EXTRACTION     Social History   Socioeconomic History   Marital status: Married    Spouse name: Not on file   Number of children: Not on file   Years of education: Not on file   Highest education level: Not on file  Occupational History   Not on file  Tobacco Use   Smoking status: Never   Smokeless tobacco: Never  Vaping Use   Vaping Use: Never used  Substance and Sexual Activity   Alcohol use: Yes   Drug use: Not Currently   Sexual activity: Not on file  Other Topics Concern   Not on file  Social History Narrative   ** Merged History Encounter **       Social Determinants of Health   Financial Resource Strain: Not on file  Food Insecurity: Not on file  Transportation Needs: Not on file  Physical Activity: Not on file  Stress: Not on file  Social Connections: Not on file   Allergies  Allergen Reactions   Sumatriptan Succinate Other (See Comments)    Muscle spasms   No family history on file.  Current Outpatient Medications (Endocrine &  Metabolic):    predniSONE (DELTASONE) 20 MG tablet, Take 1 tablet (20 mg total) by mouth daily with breakfast.   Current Outpatient Medications (Respiratory):    albuterol (VENTOLIN HFA) 108 (90 Base) MCG/ACT inhaler, INHALE 2 PUFFS BY MOUTH EVERY 6 HOURS ASNEEDED WHEEZING   budesonide-formoterol (SYMBICORT) 80-4.5 MCG/ACT inhaler, Inhale into the lungs.  Current Outpatient Medications (Analgesics):    HYDROcodone-acetaminophen (NORCO) 10-325 MG tablet, Take 1 tablet by mouth every 6 (six) hours as needed.   Current  Outpatient Medications (Other):    tiZANidine (ZANAFLEX) 2 MG tablet, Take 1 tablet (2 mg total) by mouth at bedtime.   Vitamin D, Ergocalciferol, (DRISDOL) 1.25 MG (50000 UNIT) CAPS capsule, Take 1 capsule (50,000 Units total) by mouth every 7 (seven) days.   Reviewed prior external information including notes and imaging from  primary care provider As well as notes that were available from care everywhere and other healthcare systems.  Past medical history, social, surgical and family history all reviewed in electronic medical record.  No pertanent information unless stated regarding to the chief complaint.   Review of Systems:  No headache, visual changes, nausea, vomiting, diarrhea, constipation, dizziness, abdominal pain, skin rash, fevers, chills, night sweats, weight loss, swollen lymph nodes, body aches, joint swelling, chest pain, shortness of breath, mood changes. POSITIVE muscle aches  Objective  Blood pressure 116/78, pulse 84, height 5\' 1"  (1.549 m), weight 182 lb (82.6 kg), SpO2 99 %.   General: No apparent distress alert and oriented x3 mood and affect normal, dressed appropriately.  HEENT: Pupils equal, extraocular movements intact  Respiratory: Patient's speak in full sentences and does not appear short of breath  Cardiovascular: No lower extremity edema, non tender, no erythema  Gait normal with good balance and coordination.  MSK:  \Low back exam does have some mild loss of lordosis.  Sitting comfortably in the chair.  Neurovascular intact distally.    Impression and Recommendations:     The above documentation has been reviewed and is accurate and complete , DO

## 2021-11-14 ENCOUNTER — Ambulatory Visit (INDEPENDENT_AMBULATORY_CARE_PROVIDER_SITE_OTHER): Payer: BC Managed Care – PPO | Admitting: Family Medicine

## 2021-11-14 ENCOUNTER — Ambulatory Visit: Payer: Self-pay

## 2021-11-14 ENCOUNTER — Other Ambulatory Visit: Payer: Self-pay

## 2021-11-14 VITALS — BP 116/78 | HR 84 | Ht 61.0 in | Wt 182.0 lb

## 2021-11-14 DIAGNOSIS — M5442 Lumbago with sciatica, left side: Secondary | ICD-10-CM | POA: Diagnosis not present

## 2021-11-14 DIAGNOSIS — M5441 Lumbago with sciatica, right side: Secondary | ICD-10-CM | POA: Diagnosis not present

## 2021-11-14 DIAGNOSIS — M7552 Bursitis of left shoulder: Secondary | ICD-10-CM | POA: Diagnosis not present

## 2021-11-14 DIAGNOSIS — M2550 Pain in unspecified joint: Secondary | ICD-10-CM | POA: Diagnosis not present

## 2021-11-14 LAB — CBC WITH DIFFERENTIAL/PLATELET
Basophils Absolute: 0 10*3/uL (ref 0.0–0.1)
Basophils Relative: 0.7 % (ref 0.0–3.0)
Eosinophils Absolute: 0.2 10*3/uL (ref 0.0–0.7)
Eosinophils Relative: 2.8 % (ref 0.0–5.0)
HCT: 40.5 % (ref 36.0–46.0)
Hemoglobin: 13.3 g/dL (ref 12.0–15.0)
Lymphocytes Relative: 18.8 % (ref 12.0–46.0)
Lymphs Abs: 1.2 10*3/uL (ref 0.7–4.0)
MCHC: 32.9 g/dL (ref 30.0–36.0)
MCV: 100.1 fl — ABNORMAL HIGH (ref 78.0–100.0)
Monocytes Absolute: 0.7 10*3/uL (ref 0.1–1.0)
Monocytes Relative: 10.7 % (ref 3.0–12.0)
Neutro Abs: 4.2 10*3/uL (ref 1.4–7.7)
Neutrophils Relative %: 67 % (ref 43.0–77.0)
Platelets: 210 10*3/uL (ref 150.0–400.0)
RBC: 4.04 Mil/uL (ref 3.87–5.11)
RDW: 12.6 % (ref 11.5–15.5)
WBC: 6.2 10*3/uL (ref 4.0–10.5)

## 2021-11-14 LAB — COMPREHENSIVE METABOLIC PANEL
ALT: 16 U/L (ref 0–35)
AST: 19 U/L (ref 0–37)
Albumin: 4.4 g/dL (ref 3.5–5.2)
Alkaline Phosphatase: 48 U/L (ref 39–117)
BUN: 13 mg/dL (ref 6–23)
CO2: 28 mEq/L (ref 19–32)
Calcium: 9.9 mg/dL (ref 8.4–10.5)
Chloride: 104 mEq/L (ref 96–112)
Creatinine, Ser: 0.74 mg/dL (ref 0.40–1.20)
GFR: 106.6 mL/min (ref >60.00)
Glucose, Bld: 94 mg/dL (ref 70–99)
Potassium: 4.5 mEq/L (ref 3.5–5.1)
Sodium: 139 mEq/L (ref 135–145)
Total Bilirubin: 0.8 mg/dL (ref 0.2–1.2)
Total Protein: 6.9 g/dL (ref 6.0–8.3)

## 2021-11-14 LAB — VITAMIN D 25 HYDROXY (VIT D DEFICIENCY, FRACTURES): VITD: 83.74 ng/mL (ref 30.00–100.00)

## 2021-11-14 LAB — IBC PANEL
Iron: 217 ug/dL — ABNORMAL HIGH (ref 42–145)
Saturation Ratios: 66 % — ABNORMAL HIGH (ref 20.0–50.0)
TIBC: 329 ug/dL (ref 250.0–450.0)
Transferrin: 235 mg/dL (ref 212.0–360.0)

## 2021-11-14 LAB — URIC ACID: Uric Acid, Serum: 5.2 mg/dL (ref 2.4–7.0)

## 2021-11-14 LAB — FERRITIN: Ferritin: 118.8 ng/mL (ref 10.0–291.0)

## 2021-11-14 LAB — TSH: TSH: 1.81 u[IU]/mL (ref 0.35–5.50)

## 2021-11-14 LAB — SEDIMENTATION RATE: Sed Rate: 4 mm/hr (ref 0–20)

## 2021-11-14 NOTE — Assessment & Plan Note (Signed)
Overall improvement noted.  Discussed with patient to continue the posture and ergonomics.  We will get laboratory work-up to make sure nothing else is contributing to some of the discomfort and pain.  Follow-up with me more on an as-needed basis.

## 2021-11-14 NOTE — Patient Instructions (Signed)
Labs today Increase activity Have follow up in 6 weeks but if doing ok can see me when you need me

## 2021-11-14 NOTE — Assessment & Plan Note (Signed)
Patient has had low back pain previously and did respond extremely well to the epidural.  Underwent patient to increase activity.  He does have the muscle relaxers.  We will get laboratory work-up to make sure patient can increase activity as tolerated.  Total time with patient today discussed with patient as well as reviewing patient's imaging 32 minutes.

## 2021-11-16 LAB — PTH, INTACT AND CALCIUM
Calcium: 9.7 mg/dL (ref 8.6–10.2)
PTH: 11 pg/mL — ABNORMAL LOW (ref 16–77)

## 2021-12-23 NOTE — Progress Notes (Signed)
[name] [address] [place] [address] Phone: [phone] Subjective:   Rebekah [name], am serving as a scribe for Dr. Antoine Primas.  This visit occurred during the SARS-CoV-2 public health emergency.  Safety protocols were in place, including screening questions prior to the visit, additional usage of staff PPE, and extensive cleaning of exam room while observing appropriate contact time as indicated for disinfecting solutions.   I'm seeing this patient by the request  of:  Pcp, No  CC: Head injury  [id number]  11/14/2021 Patient has had low back pain previously and did respond extremely well to the epidural.  Underwent patient to increase activity.  He does have the muscle relaxers.  We will get laboratory work-up to make sure patient can increase activity as tolerated.  Total time with patient today discussed with patient as well as reviewing patient's imaging 32 minutes.  Overall improvement noted.  Discussed with patient to continue the posture and ergonomics.  We will get laboratory work-up to make sure nothing else is contributing to some of the discomfort and pain.  Follow-up with me more on an as-needed basis.  Update 12/23/2021 Rebekah [name] is a 31 y.o. female coming in with complaint of L shoulder and LBP. Patient states that she fell on Friday and hit her head. Having headache, dizziness and photophobia. Last head injury was at 31yo. Patient presents today wearing sunglasses. Has been taking her mother's Zofran every 6-7 hours. Did land on R shoulder which is sore. Patient having trouble sleeping.  Patient states that it slowly is getting better at the moment.  Would state that it is better than Friday but still nowhere near her baseline.  Patient did try zanaflex and sees spots when she takes this medication. Patient has Flexeril from another provider and would prefer to get this filled.   Prior to fall shoulder was  doing better. Back pain increased after fall but was doing ok.       No past medical history on file. Past Surgical History:  Procedure Laterality Date   WISDOM TOOTH EXTRACTION     Social History   Socioeconomic History   Marital status: Married    Spouse name: Not on file   Number of children: Not on file   Years of education: Not on file   Highest education level: Not on file  Occupational History   Not on file  Tobacco Use   Smoking status: Never   Smokeless tobacco: Never  Vaping Use   Vaping Use: Never used  Substance and Sexual Activity   Alcohol use: Yes   Drug use: Not Currently   Sexual activity: Not on file  Other Topics Concern   Not on file  Social History Narrative   ** Merged History Encounter **       Social Determinants of Health   Financial Resource Strain: Not on file  Food Insecurity: Not on file  Transportation Needs: Not on file  Physical Activity: Not on file  Stress: Not on file  Social Connections: Not on file   Allergies  Allergen Reactions   Sumatriptan Succinate Other (See Comments)    Muscle spasms   No family history on file.  Current Outpatient Medications (Endocrine & Metabolic):    predniSONE (DELTASONE) 20 MG tablet, Take 1 tablet (20 mg total) by mouth daily with breakfast.   Current Outpatient Medications (Respiratory):    albuterol (VENTOLIN HFA) 108 (90 Base) MCG/ACT inhaler, INHALE  2 PUFFS BY MOUTH EVERY 6 HOURS ASNEEDED WHEEZING   budesonide-formoterol (SYMBICORT) 80-4.5 MCG/ACT inhaler, Inhale into the lungs.  Current Outpatient Medications (Analgesics):    HYDROcodone-acetaminophen (NORCO) 10-325 MG tablet, Take 1 tablet by mouth every 6 (six) hours as needed.   Current Outpatient Medications (Other):    cyclobenzaprine (FLEXERIL) 5 MG tablet, Take 1 tablet (5 mg total) by mouth 2 (two) times daily as needed for muscle spasms.   ondansetron (ZOFRAN) 4 MG tablet, Take 1 tablet (4 mg total) by mouth every 8  (eight) hours as needed for nausea or vomiting.   tiZANidine (ZANAFLEX) 2 MG tablet, Take 1 tablet (2 mg total) by mouth at bedtime.   Vitamin D, Ergocalciferol, (DRISDOL) 1.25 MG (50000 UNIT) CAPS capsule, Take 1 capsule (50,000 Units total) by mouth every 7 (seven) days.   Reviewed prior external information including notes and imaging from  primary care provider As well as notes that were available from care everywhere and other healthcare systems.  Past medical history, social, surgical and family history all reviewed in electronic medical record.  No pertanent information unless stated regarding to the chief complaint.   Review of Systems:  No  visual changes,  vomiting, diarrhea, constipation,  abdominal pain, skin rash, fevers, chills, night sweats, weight loss, swollen lymph nodes, body aches, joint swelling, chest pain, shortness of breath, mood changes. POSITIVE muscle aches, headache, dizziness, nausea  Objective  Blood pressure 124/80, pulse (!) 105, height 5\' 1"  (1.549 m), weight 176 lb (79.8 kg), SpO2 99 %.   General: No apparent distress alert and oriented x3 mood and affect normal, dressed appropriately.  HEENT: Pupils equal, extraocular movements intact but does have some mild lateral nystagmus noted.  Patient does have some difficulties with serial sevens as well as some mild difficulty with recall.     Impression and Recommendations:    The above documentation has been reviewed and is accurate and complete , DO

## 2021-12-25 ENCOUNTER — Encounter: Payer: Self-pay | Admitting: Family Medicine

## 2021-12-26 ENCOUNTER — Ambulatory Visit: Payer: Self-pay

## 2021-12-26 ENCOUNTER — Ambulatory Visit (INDEPENDENT_AMBULATORY_CARE_PROVIDER_SITE_OTHER): Payer: BC Managed Care – PPO | Admitting: Family Medicine

## 2021-12-26 ENCOUNTER — Other Ambulatory Visit: Payer: Self-pay

## 2021-12-26 ENCOUNTER — Encounter: Payer: Self-pay | Admitting: Family Medicine

## 2021-12-26 VITALS — BP 124/80 | HR 105 | Ht 61.0 in | Wt 176.0 lb

## 2021-12-26 DIAGNOSIS — S060X0A Concussion without loss of consciousness, initial encounter: Secondary | ICD-10-CM | POA: Diagnosis not present

## 2021-12-26 DIAGNOSIS — S060XAA Concussion with loss of consciousness status unknown, initial encounter: Secondary | ICD-10-CM | POA: Insufficient documentation

## 2021-12-26 DIAGNOSIS — M25512 Pain in left shoulder: Secondary | ICD-10-CM

## 2021-12-26 MED ORDER — ONDANSETRON HCL 4 MG PO TABS: 4.0000 mg | ORAL_TABLET | Freq: Three times a day (TID) | ORAL | 0 refills | Status: DC | PRN

## 2021-12-26 MED ORDER — CYCLOBENZAPRINE HCL 5 MG PO TABS: 5.0000 mg | ORAL_TABLET | Freq: Two times a day (BID) | ORAL | 0 refills | Status: DC | PRN

## 2021-12-26 NOTE — Patient Instructions (Addendum)
Flexeril 2x daily as needed 200mg  CoQ10 2 grams of Fish oil 2000 IU of Vitamin D If headache is worse or nausea and vomiting worsen please go to emergency room immediately.  See me again in 2 weeks (ok to double book)

## 2021-12-26 NOTE — Assessment & Plan Note (Signed)
Patient does have signs and symptoms consistent with a mild concussion.  Discussed icing regimen and home exercises, discussed which activities to do which wants to avoid, discussed home exercises.  Increase activity slowly.  Discussed over-the-counter medications that I think will also be beneficial.  Discussed that worsening symptoms to seek medical attention immediately and go to the emergency room because CT scan would be necessary to patient improving I think will be okay.  Patient given some muscle relaxers and a low-dose Flexeril as well as the Zofran.  Patient is taking both of these in the past and will prescribe her other providers.  Follow-up with me again in 2 weeks otherwise.

## 2022-01-05 ENCOUNTER — Encounter: Payer: Self-pay | Admitting: Family Medicine

## 2022-01-06 NOTE — Progress Notes (Unsigned)
[name] [address] [place] [address] Phone: [phone] Subjective:   [name], am serving as a scribe for Dr. Antoine Primas.  This visit occurred during the SARS-CoV-2 public health emergency.  Safety protocols were in place, including screening questions prior to the visit, additional usage of staff PPE, and extensive cleaning of exam room while observing appropriate contact time as indicated for disinfecting solutions.    I'm seeing this patient by the request  of:  Pcp, No  CC:   [id number]  12/26/2021 Patient does have signs and symptoms consistent with a mild concussion.  Discussed icing regimen and home exercises, discussed which activities to do which wants to avoid, discussed home exercises.  Increase activity slowly.  Discussed over-the-counter medications that I think will also be beneficial.  Discussed that worsening symptoms to seek medical attention immediately and go to the emergency room because CT scan would be necessary to patient improving I think will be okay.  Patient given some muscle relaxers and a low-dose Flexeril as well as the Zofran.  Patient is taking both of these in the past and will prescribe her other providers.  Follow-up with me again in 2 weeks otherwise.  Update 01/09/2022 [name] Jomayra Novitsky is a 31 y.o. female coming in with complaint of L shoulder and head injury follow-up. Patient states that she does still have nausea and headaches but that her symptoms are improving. She did workout last week and felt like her L hip is catching when she is doing squats. Sensation is felt in anterior hip.        No past medical history on file. Past Surgical History:  Procedure Laterality Date   WISDOM TOOTH EXTRACTION     Social History   Socioeconomic History   Marital status: Married    Spouse name: Not on file   Number of children: Not on file   Years of education: Not on file   Highest  education level: Not on file  Occupational History   Not on file  Tobacco Use   Smoking status: Never   Smokeless tobacco: Never  Vaping Use   Vaping Use: Never used  Substance and Sexual Activity   Alcohol use: Yes   Drug use: Not Currently   Sexual activity: Not on file  Other Topics Concern   Not on file  Social History Narrative   ** Merged History Encounter **       Social Determinants of Health   Financial Resource Strain: Not on file  Food Insecurity: Not on file  Transportation Needs: Not on file  Physical Activity: Not on file  Stress: Not on file  Social Connections: Not on file   Allergies  Allergen Reactions   Sumatriptan Succinate Other (See Comments)    Muscle spasms   No family history on file.  Current Outpatient Medications (Endocrine & Metabolic):    predniSONE (DELTASONE) 20 MG tablet, Take 1 tablet (20 mg total) by mouth daily with breakfast.   Current Outpatient Medications (Respiratory):    albuterol (VENTOLIN HFA) 108 (90 Base) MCG/ACT inhaler, INHALE 2 PUFFS BY MOUTH EVERY 6 HOURS ASNEEDED WHEEZING   budesonide-formoterol (SYMBICORT) 80-4.5 MCG/ACT inhaler, Inhale into the lungs.  Current Outpatient Medications (Analgesics):    HYDROcodone-acetaminophen (NORCO) 10-325 MG tablet, Take 1 tablet by mouth every 6 (six) hours as needed.   Current Outpatient Medications (Other):    cyclobenzaprine (FLEXERIL) 5 MG tablet, Take 1 tablet (5 mg total)  by mouth 2 (two) times daily as needed for muscle spasms.   ondansetron (ZOFRAN) 4 MG tablet, Take 1 tablet (4 mg total) by mouth every 8 (eight) hours as needed for nausea or vomiting.   tiZANidine (ZANAFLEX) 2 MG tablet, Take 1 tablet (2 mg total) by mouth at bedtime.   Vitamin D, Ergocalciferol, (DRISDOL) 1.25 MG (50000 UNIT) CAPS capsule, Take 1 capsule (50,000 Units total) by mouth every 7 (seven) days.   Reviewed prior external information including notes and imaging from  primary care  provider As well as notes that were available from care everywhere and other healthcare systems.  Past medical history, social, surgical and family history all reviewed in electronic medical record.  No pertanent information unless stated regarding to the chief complaint.   Review of Systems:  No headache, visual changes, nausea, vomiting, diarrhea, constipation, dizziness, abdominal pain, skin rash, fevers, chills, night sweats, weight loss, swollen lymph nodes, body aches, joint swelling, chest pain, shortness of breath, mood changes. POSITIVE muscle aches  Objective  There were no vitals taken for this visit.   General: No apparent distress alert and oriented x3 mood and affect normal, dressed appropriately.  HEENT: Pupils equal, extraocular movements intact  Respiratory: Patient's speak in full sentences and does not appear short of breath  Cardiovascular: No lower extremity edema, non tender, no erythema  Gait normal with good balance and coordination.  MSK:  Non tender with full range of motion and good stability and symmetric strength and tone of shoulders, elbows, wrist, hip, knee and ankles bilaterally.     Impression and Recommendations:     The above documentation has been reviewed and is accurate and complete [name]

## 2022-01-09 ENCOUNTER — Encounter: Payer: Self-pay | Admitting: Family Medicine

## 2022-01-09 ENCOUNTER — Ambulatory Visit (INDEPENDENT_AMBULATORY_CARE_PROVIDER_SITE_OTHER): Payer: BC Managed Care – PPO | Admitting: Family Medicine

## 2022-01-09 ENCOUNTER — Ambulatory Visit (INDEPENDENT_AMBULATORY_CARE_PROVIDER_SITE_OTHER): Payer: BC Managed Care – PPO

## 2022-01-09 ENCOUNTER — Other Ambulatory Visit: Payer: Self-pay

## 2022-01-09 VITALS — BP 132/88 | HR 81 | Ht 61.0 in | Wt 178.0 lb

## 2022-01-09 DIAGNOSIS — M9903 Segmental and somatic dysfunction of lumbar region: Secondary | ICD-10-CM

## 2022-01-09 DIAGNOSIS — M9908 Segmental and somatic dysfunction of rib cage: Secondary | ICD-10-CM

## 2022-01-09 DIAGNOSIS — M9902 Segmental and somatic dysfunction of thoracic region: Secondary | ICD-10-CM

## 2022-01-09 DIAGNOSIS — M9901 Segmental and somatic dysfunction of cervical region: Secondary | ICD-10-CM

## 2022-01-09 DIAGNOSIS — M5442 Lumbago with sciatica, left side: Secondary | ICD-10-CM

## 2022-01-09 DIAGNOSIS — M5441 Lumbago with sciatica, right side: Secondary | ICD-10-CM

## 2022-01-09 DIAGNOSIS — M25552 Pain in left hip: Secondary | ICD-10-CM

## 2022-01-09 DIAGNOSIS — M9904 Segmental and somatic dysfunction of sacral region: Secondary | ICD-10-CM

## 2022-01-09 DIAGNOSIS — S060X0D Concussion without loss of consciousness, subsequent encounter: Secondary | ICD-10-CM

## 2022-01-09 IMAGING — DX DG HIP (WITH OR WITHOUT PELVIS) 2-3V*L*
3 series · 3 of 3 positions shown · non-contrast
Comparison: None.

CLINICAL DATA: Left hip pain for 3 weeks after falling when a swing
broke.

EXAM:
DG HIP (WITH OR WITHOUT PELVIS) 2-3V LEFT

[pelvis ap]
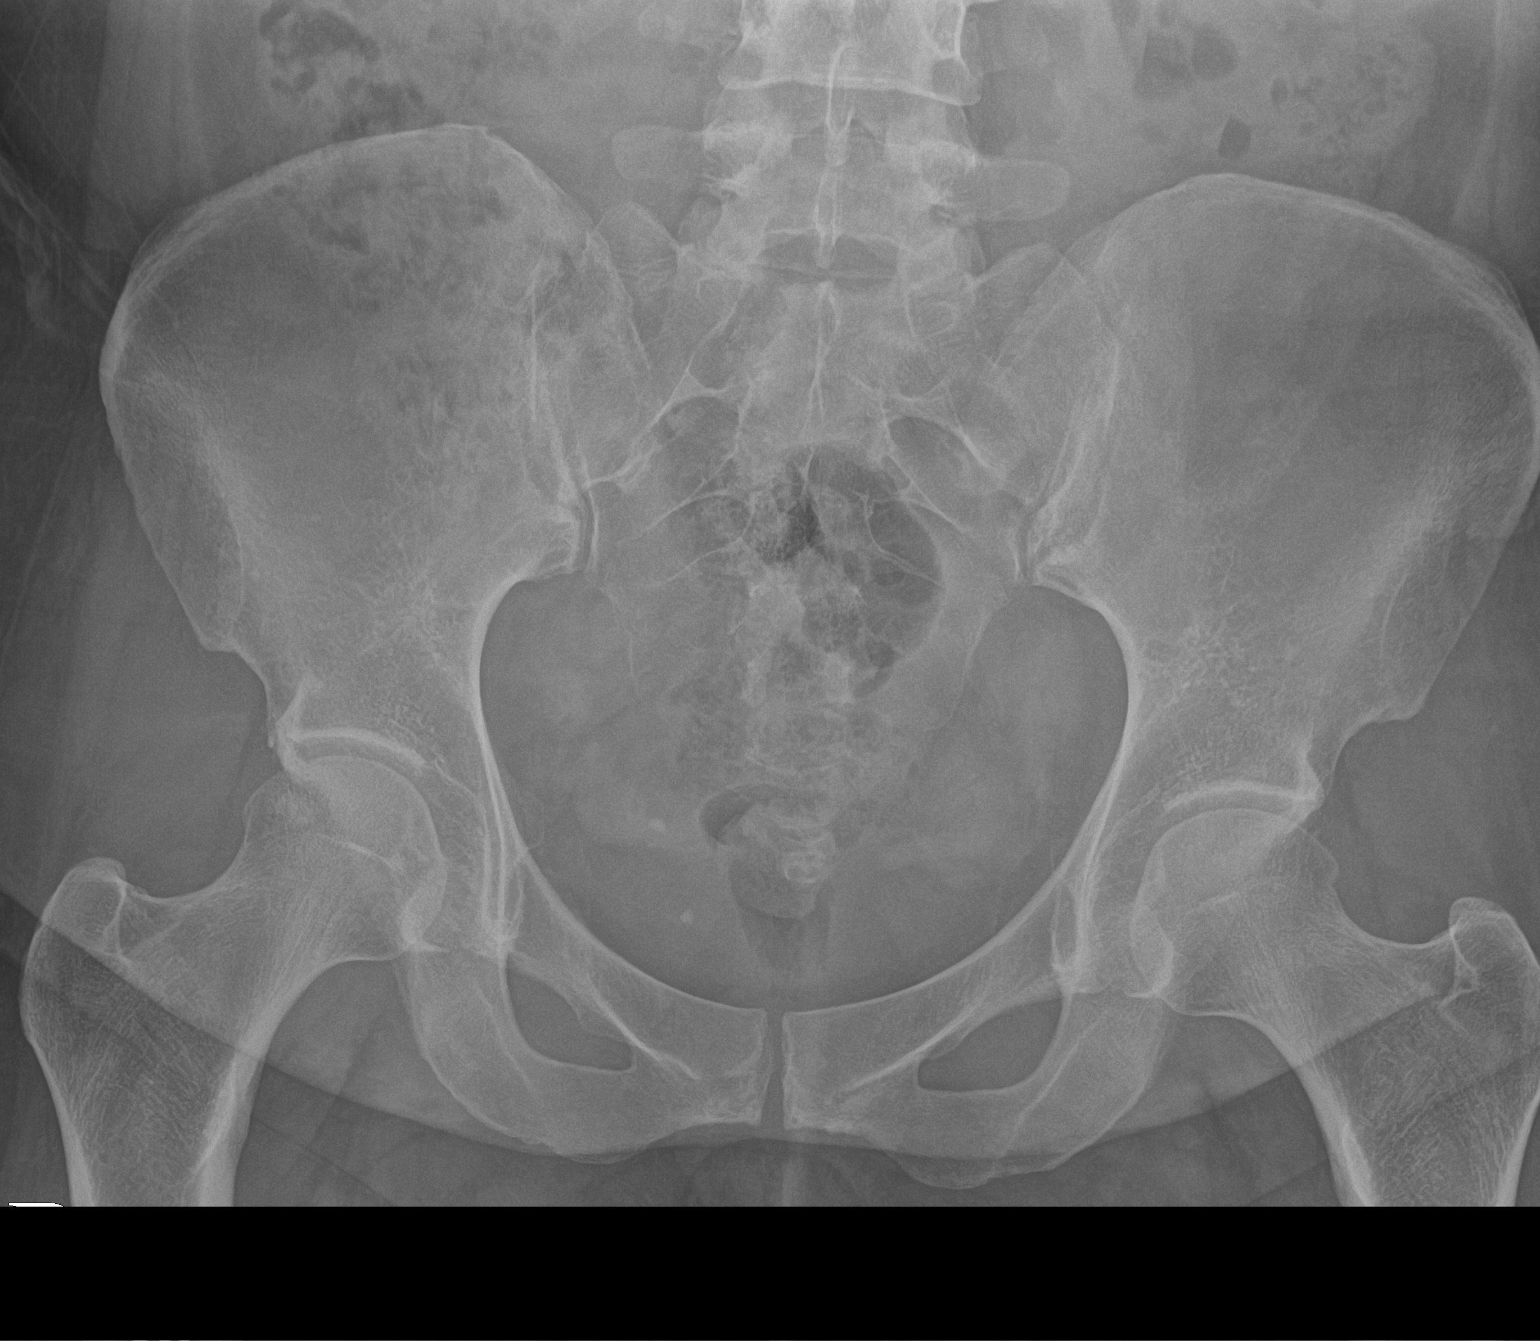

[hip ap]
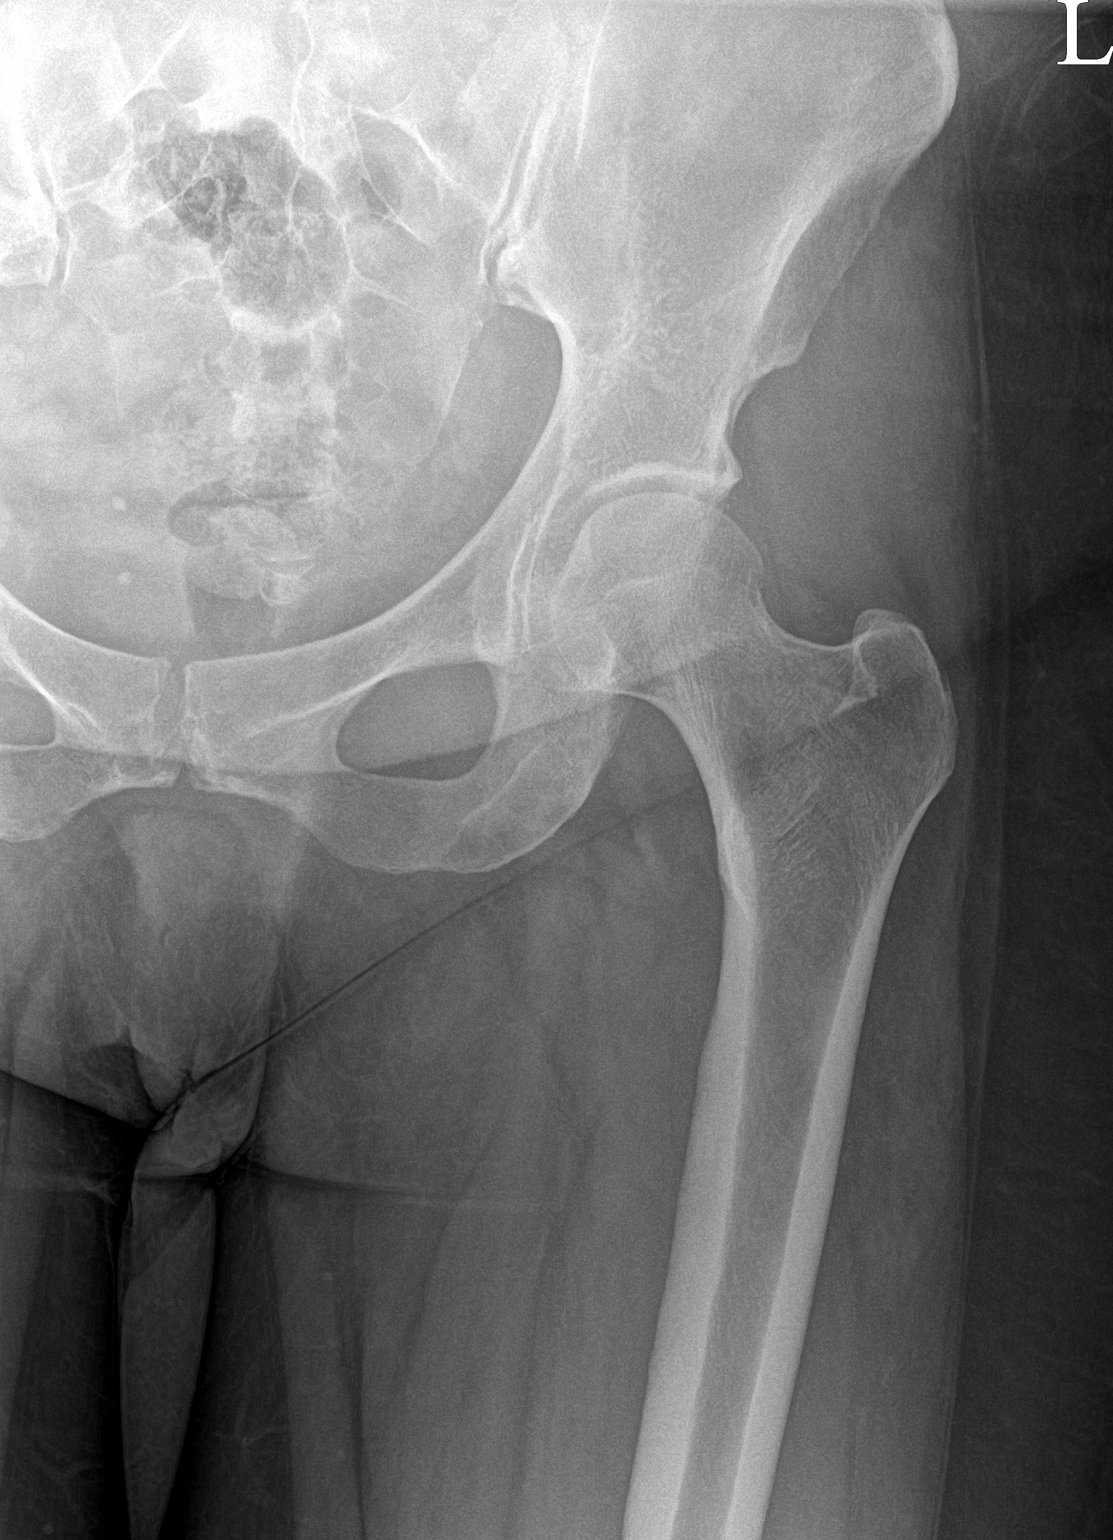

[hip frog leg]
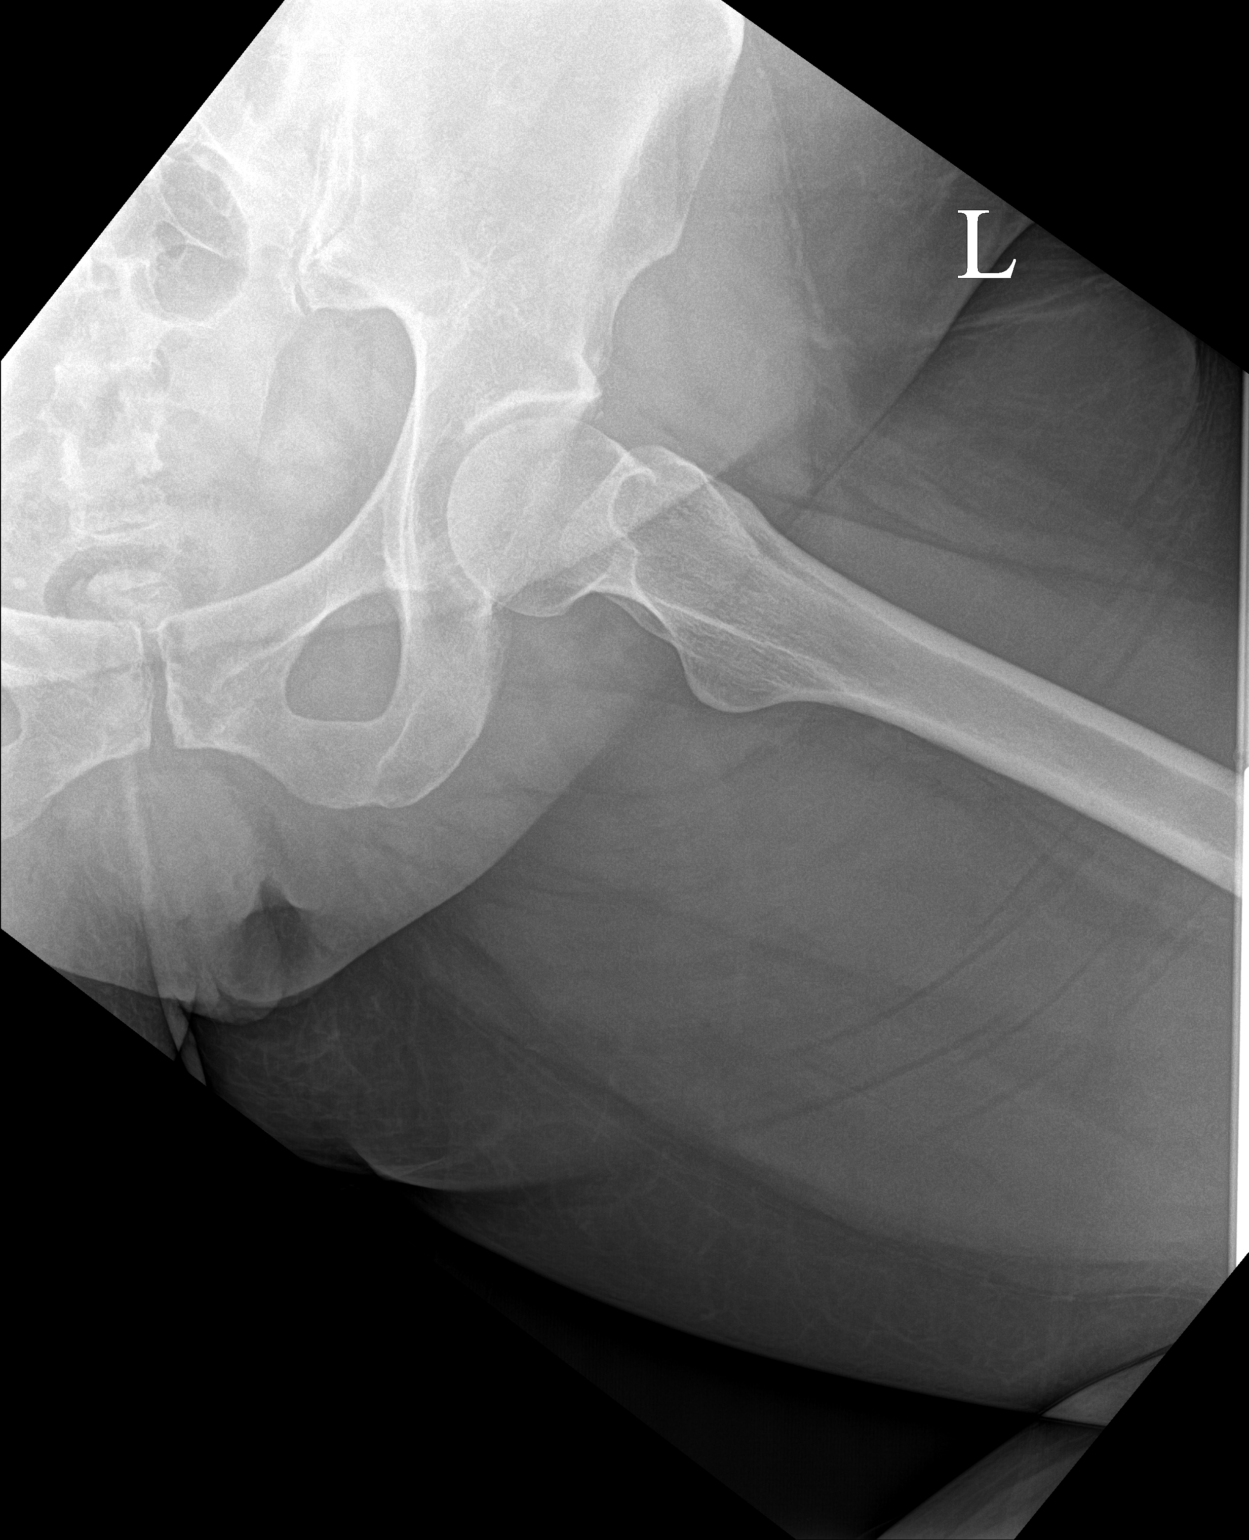

[3 of 3 positions shown; findings below may reference images not displayed]

FINDINGS: Normal bone mineralization. Joint spaces are preserved. No acute
fracture is seen. No dislocation. Vascular phleboliths overlie the
pelvis.
IMPRESSION: No significant degenerative change.  No acute fracture.

## 2022-01-09 NOTE — Patient Instructions (Signed)
Good to see you! ?Xrays today ?Try stretches after working out ?Glad concussion is pretty much gone ?See you again in 6-8 weeks ?

## 2022-01-10 DIAGNOSIS — M9904 Segmental and somatic dysfunction of sacral region: Secondary | ICD-10-CM | POA: Insufficient documentation

## 2022-01-10 NOTE — Assessment & Plan Note (Signed)
Chronic, with exacerbation.  Does get pain medication from another provider.  We discussed trying to use that less frequently.  He does have a muscle relaxer.  Discussed icing regimen and home exercises.  Attempted osteopathic manipulation with relatively good response.  Patient will try conservative therapy otherwise with home exercises and follow-up with me again in 6 to 8 weeks.  We will get hip x-ray as well secondary to the discomfort. ?

## 2022-01-10 NOTE — Assessment & Plan Note (Signed)
Patient's concussion is completely resolved at this point.  No significant signs of nystagmus.  Patient seems to be doing well. ?

## 2022-01-10 NOTE — Assessment & Plan Note (Signed)

## 2022-02-02 ENCOUNTER — Other Ambulatory Visit: Payer: Self-pay

## 2022-02-02 ENCOUNTER — Encounter: Payer: Self-pay | Admitting: Family Medicine

## 2022-02-02 MED ORDER — ONDANSETRON HCL 4 MG PO TABS: 4.0000 mg | ORAL_TABLET | Freq: Three times a day (TID) | ORAL | 0 refills | Status: DC | PRN

## 2022-02-08 ENCOUNTER — Encounter: Payer: Self-pay | Admitting: Family Medicine

## 2022-02-08 DIAGNOSIS — M25552 Pain in left hip: Secondary | ICD-10-CM

## 2022-02-10 ENCOUNTER — Other Ambulatory Visit: Payer: Self-pay

## 2022-02-10 DIAGNOSIS — M25552 Pain in left hip: Secondary | ICD-10-CM

## 2022-02-17 NOTE — Progress Notes (Signed)
?Rebekah [name] D.O. ?[facility] ?[address] [place] [address] ?Phone: [phone] ?Subjective:   ?I, Rebekah [name], am serving as a scribe for Dr. Antoine Primas. ? ?This visit occurred during the SARS-CoV-2 public health emergency.  Safety protocols were in place, including screening questions prior to the visit, additional usage of staff PPE, and extensive cleaning of exam room while observing appropriate contact time as indicated for disinfecting solutions.  ? ? ?I'm seeing this patient by the request  of:  Pcp, No ? ?CC: left hip pain and low back pain  ? ?[id number]  ?01/09/2022 ?Chronic, with exacerbation.  Does get pain medication from another provider.  We discussed trying to use that less frequently.  He does have a muscle relaxer.  Discussed icing regimen and home exercises.  Attempted osteopathic manipulation with relatively good response.  Patient will try conservative therapy otherwise with home exercises and follow-up with me again in 6 to 8 weeks.  We will get hip x-ray as well secondary to the discomfort. ? ?Update 02/20/2022 ?Rebekah [name] is a 31 y.o. female coming in with complaint of L hip pain and LBP. Patient states that she continues to have pain in anterior hip with squatting. Also notes a decrease in ROM if she does not stretch. One week ago her lower back pain increased. Pain can be 10/10 at times. Patient said that she has not felt pain like this in a while.  ? ? ?  ? ?No past medical history on file. ?Past Surgical History:  ?Procedure Laterality Date  ? WISDOM TOOTH EXTRACTION    ? ?Social History  ? ?Socioeconomic History  ? Marital status: Married  ?  Spouse name: Not on file  ? Number of children: Not on file  ? Years of education: Not on file  ? Highest education level: Not on file  ?Occupational History  ? Not on file  ?Tobacco Use  ? Smoking status: Never  ? Smokeless tobacco: Never  ?Vaping Use  ? Vaping Use: Never used  ?Substance and Sexual  Activity  ? Alcohol use: Yes  ? Drug use: Not Currently  ? Sexual activity: Not on file  ?Other Topics Concern  ? Not on file  ?Social History Narrative  ? ** Merged History Encounter **  ?    ? ?Social Determinants of Health  ? ?Financial Resource Strain: Not on file  ?Food Insecurity: Not on file  ?Transportation Needs: Not on file  ?Physical Activity: Not on file  ?Stress: Not on file  ?Social Connections: Not on file  ? ?Allergies  ?Allergen Reactions  ? Sumatriptan Succinate Other (See Comments)  ?  Muscle spasms  ? ?No family history on file. ? ?Current Outpatient Medications (Endocrine & Metabolic):  ?  predniSONE (DELTASONE) 20 MG tablet, Take 1 tablet (20 mg total) by mouth daily with breakfast. ? ? ?Current Outpatient Medications (Respiratory):  ?  albuterol (VENTOLIN HFA) 108 (90 Base) MCG/ACT inhaler, INHALE 2 PUFFS BY MOUTH EVERY 6 HOURS ASNEEDED WHEEZING ?  budesonide-formoterol (SYMBICORT) 80-4.5 MCG/ACT inhaler, Inhale into the lungs. ? ?Current Outpatient Medications (Analgesics):  ?  HYDROcodone-acetaminophen (NORCO) 10-325 MG tablet, Take 1 tablet by mouth every 6 (six) hours as needed. ? ? ?Current Outpatient Medications (Other):  ?  cyclobenzaprine (FLEXERIL) 5 MG tablet, Take 1 tablet (5 mg total) by mouth 2 (two) times daily as needed for muscle spasms. ?  ondansetron (ZOFRAN) 4 MG tablet, Take 1 tablet (4 mg total) by  mouth every 8 (eight) hours as needed for nausea or vomiting. ?  tiZANidine (ZANAFLEX) 2 MG tablet, Take 1 tablet (2 mg total) by mouth at bedtime. ?  Vitamin D, Ergocalciferol, (DRISDOL) 1.25 MG (50000 UNIT) CAPS capsule, Take 1 capsule (50,000 Units total) by mouth every 7 (seven) days. ? ? ?Reviewed prior external information including notes and imaging from  ?primary care provider ?As well as notes that were available from care everywhere and other healthcare systems. ? ?Past medical history, social, surgical and family history all reviewed in electronic medical record.  No  pertanent information unless stated regarding to the chief complaint.  ? ?Review of Systems: ? No headache, visual changes, nausea, vomiting, diarrhea, constipation, dizziness, abdominal pain, skin rash, fevers, chills, night sweats, weight loss, swollen lymph nodes, body aches, joint swelling, chest pain, shortness of breath, mood changes. POSITIVE muscle aches ? ?Objective  ?Blood pressure 110/84, pulse 76, height 5\' 1"  (1.549 m), weight 173 lb (78.5 kg), SpO2 99 %. ?  ?General: No apparent distress alert and oriented x3 mood and affect normal, dressed appropriately.  ?HEENT: Pupils equal, extraocular movements intact  ?Respiratory: Patient's speak in full sentences and does not appear short of breath  ?Cardiovascular: No lower extremity edema, non tender, no erythema  ?Gait mild antalgic gait noted. ?Back exam does have some loss of lordosis.  Tender to palpation noticed out of proportion to the amount of palpation.  Patient does have tightness with FABER test.  Only 5 degrees of internal rotation with increasing discomfort of the left hip.  Tender to palpation over the left sacroiliac joint. ? ?Osteopathic findings ? ?C4 flexed rotated and side bent left ?C6 flexed rotated and side bent left ?T3 extended rotated and side bent right inhaled third rib ?T7 extended rotated and side bent left ?L5 flexed rotated and side bent right ?Sacrum left on left ? ? ?  ?Impression and Recommendations:  ?  ? ?The above documentation has been reviewed and is accurate and complete , DO ? ? ? ?

## 2022-02-20 ENCOUNTER — Ambulatory Visit: Payer: BC Managed Care – PPO | Admitting: Family Medicine

## 2022-02-20 ENCOUNTER — Encounter: Payer: Self-pay | Admitting: Family Medicine

## 2022-02-20 VITALS — BP 110/84 | HR 76 | Ht 61.0 in | Wt 173.0 lb

## 2022-02-20 DIAGNOSIS — M9901 Segmental and somatic dysfunction of cervical region: Secondary | ICD-10-CM

## 2022-02-20 DIAGNOSIS — M9903 Segmental and somatic dysfunction of lumbar region: Secondary | ICD-10-CM | POA: Diagnosis not present

## 2022-02-20 DIAGNOSIS — M5442 Lumbago with sciatica, left side: Secondary | ICD-10-CM | POA: Diagnosis not present

## 2022-02-20 DIAGNOSIS — M9908 Segmental and somatic dysfunction of rib cage: Secondary | ICD-10-CM

## 2022-02-20 DIAGNOSIS — M9904 Segmental and somatic dysfunction of sacral region: Secondary | ICD-10-CM

## 2022-02-20 DIAGNOSIS — M5441 Lumbago with sciatica, right side: Secondary | ICD-10-CM

## 2022-02-20 DIAGNOSIS — M9902 Segmental and somatic dysfunction of thoracic region: Secondary | ICD-10-CM

## 2022-02-20 NOTE — Patient Instructions (Signed)
Refill Zofran ?Get MRI  ?See me in 4-6 weeks ?

## 2022-02-20 NOTE — Assessment & Plan Note (Signed)
Attempted OMT again, still have back pain but seems to respond to OMT.  Left hip is more concerning with loss of ROM especially with internal range of motion.  Patient is set up for an MRI in the near future and hopefully will be beneficial and tell us if there is a labral pathology or if everything else can get better with just physical therapy and manipulation.  Patient does have pain medications from different providers.  We have discussed with patient the different other treatment options.  Patient will follow-up again in 6 to 8 weeks otherwise ?

## 2022-02-23 ENCOUNTER — Ambulatory Visit
Admission: RE | Admit: 2022-02-23 | Discharge: 2022-02-23 | Disposition: A | Discharge status: Home / Self Care | Payer: BC Managed Care – PPO | Source: Ambulatory Visit | Attending: Family Medicine | Admitting: Family Medicine

## 2022-02-23 DIAGNOSIS — M25552 Pain in left hip: Secondary | ICD-10-CM | POA: Diagnosis present

## 2022-02-23 IMAGING — MR MR HIP*L* W/CM
4 of 6 series · 20 of 40 positions shown · IV contrast (gadavist)
Comparison: Radiographs dated [DATE]

CLINICAL DATA: Left hip pain. Fell off a swing early to mid
[place]IS[place].

EXAM:
MRI OF THE LEFT HIP WITH CONTRAST
TECHNIQUE: Multiplanar, multisequence MR imaging was performed following the
administration of intravenous contrast.
CONTRAST:  0.05mL GADAVIST GADOBUTROL 1 MMOL/ML IV SOLN

[Series 8: T1 · coronal · left · 4.0mm · 0.59mm/px · 8 of 38 slices shown]
[im 1/38]
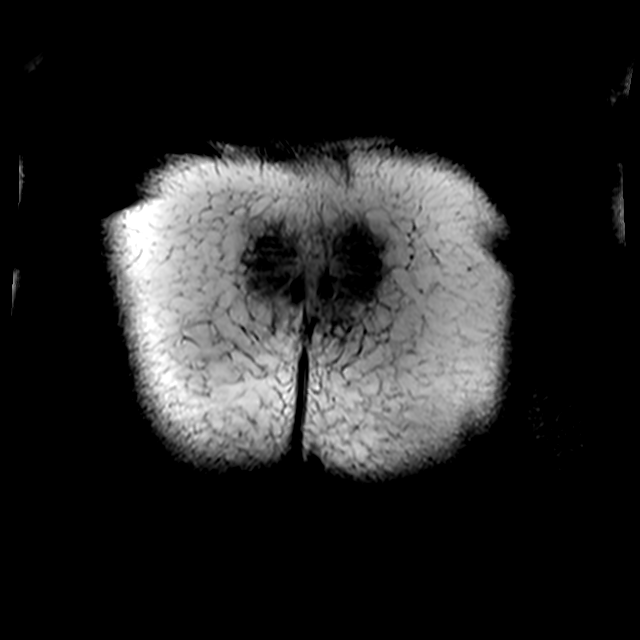
[im 6/38]
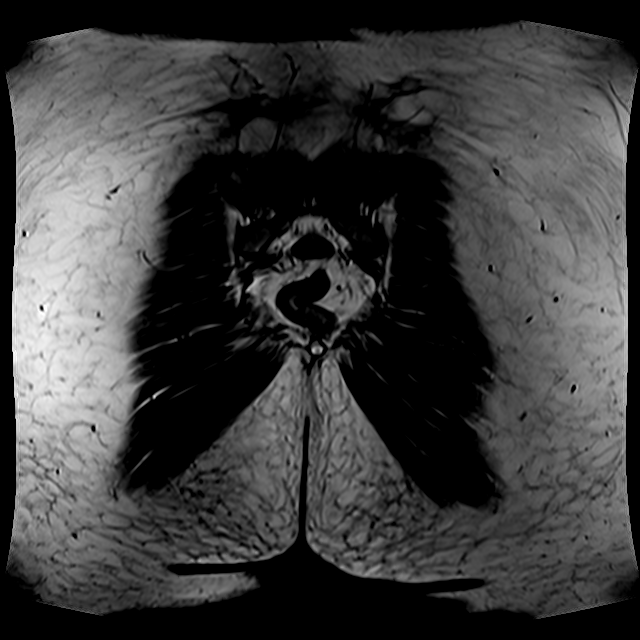
[im 11/38]
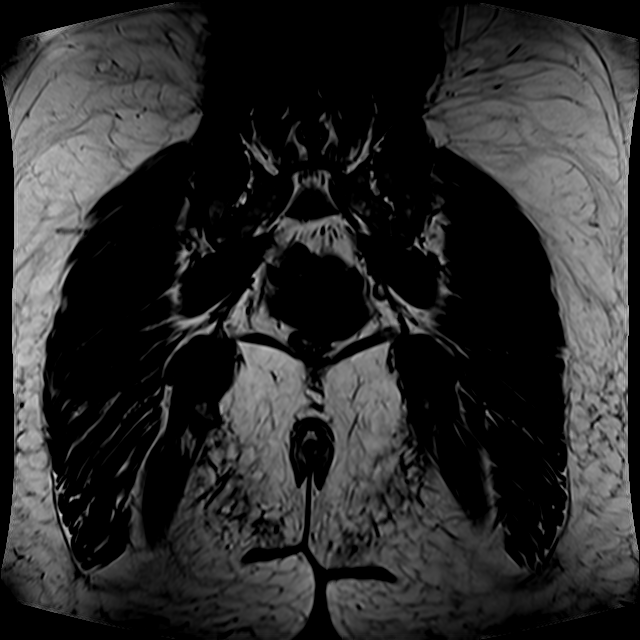
[im 16/38]
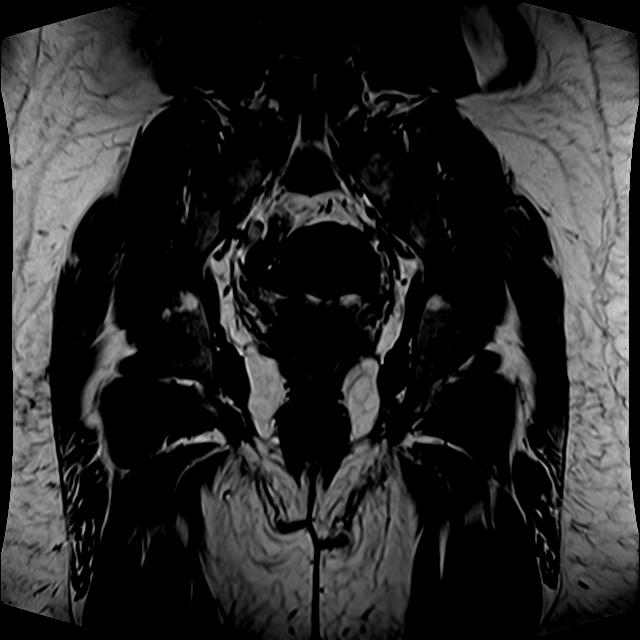
[im 22/38]
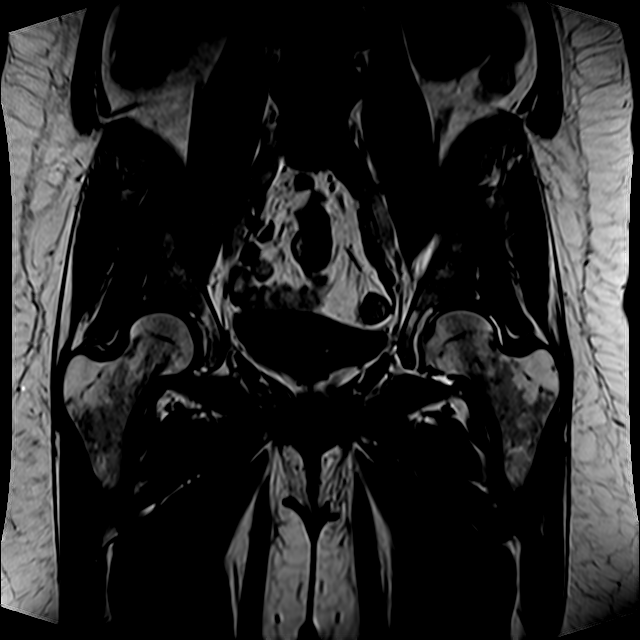
[im 27/38]
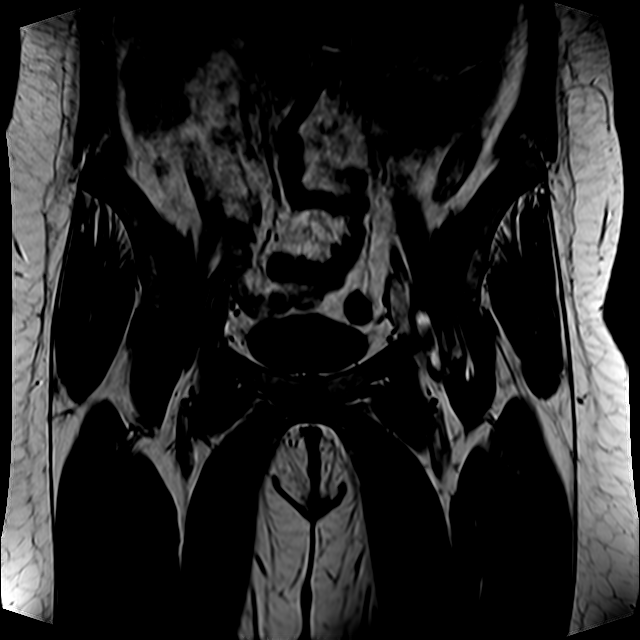
[im 32/38]
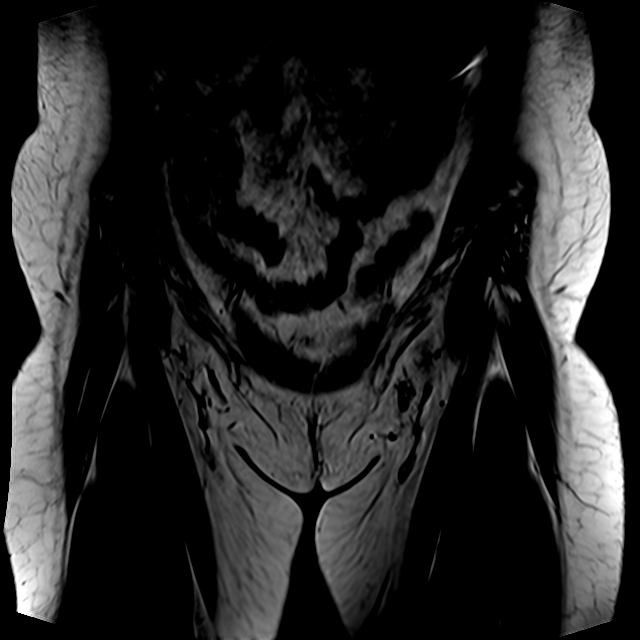
[im 38/38]
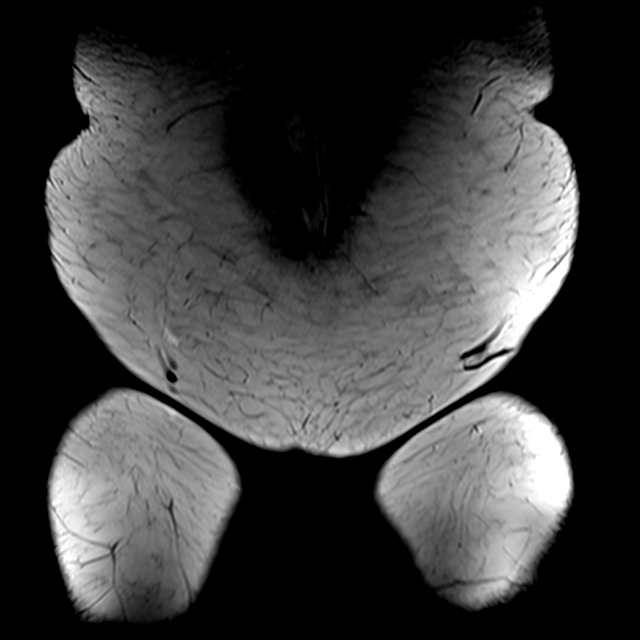

[Series 10: T2 fat-sat · axial · left · 4.0mm · 0.70mm/px · z∈[-59,+86]mm · 6 of 30 slices shown]
[im 1/30]
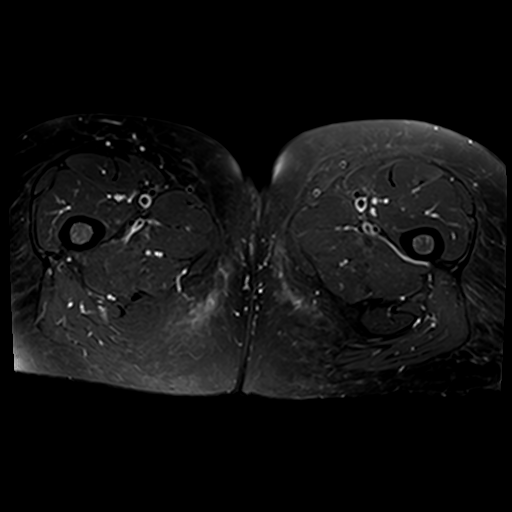
[im 6/30]
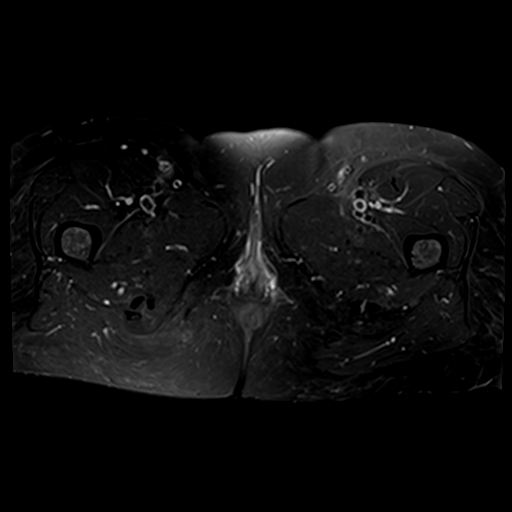
[im 12/30]
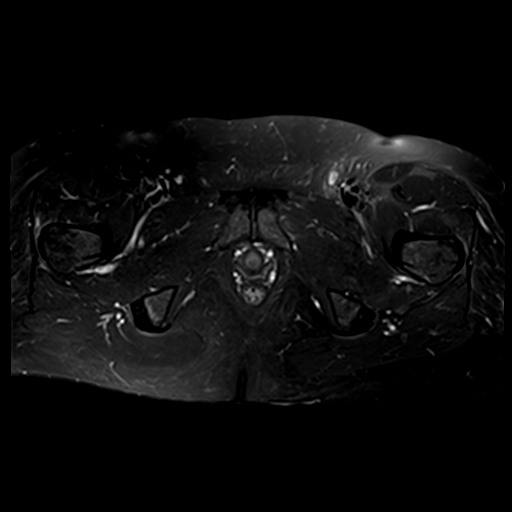
[im 18/30]
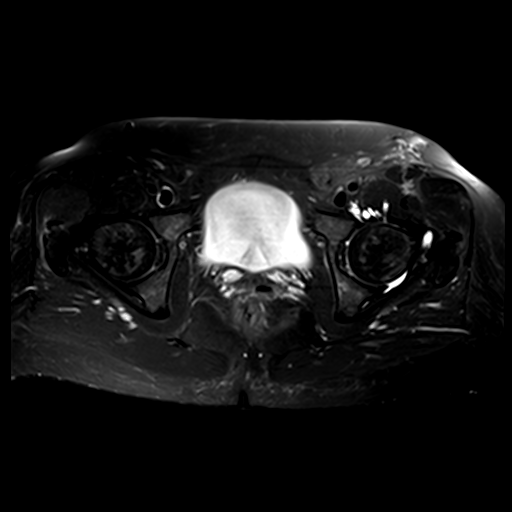
[im 24/30]
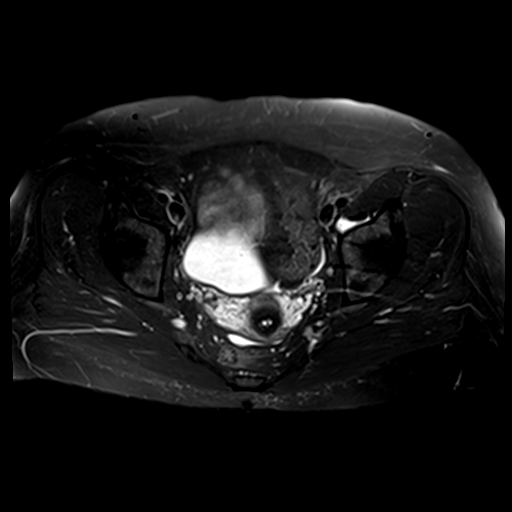
[im 30/30]
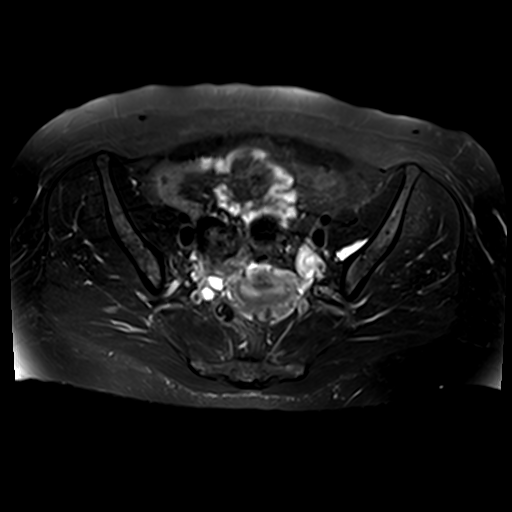

[Series 11: T1 fat-sat · axial · left · 4.0mm · 0.70mm/px · z∈[+36,+127]mm · 3 of 30 slices shown (1 of 2)]
[im 6/30]
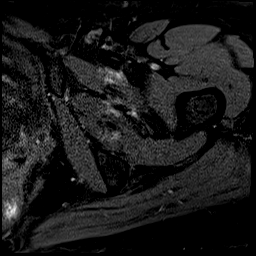
[im 18/30]
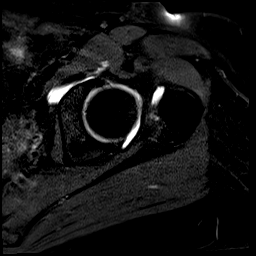
[im 30/30]
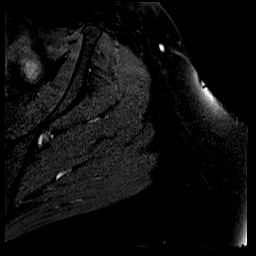

[Series 12: T1 fat-sat · sagittal · left · 4.0mm · 0.62mm/px · 3 of 28 slices shown (2 of 2)]
[im 6/28]
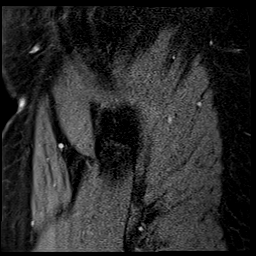
[im 17/28]
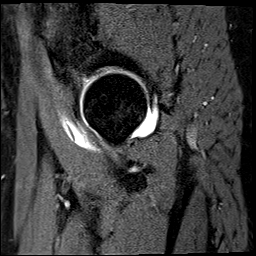
[im 28/28]
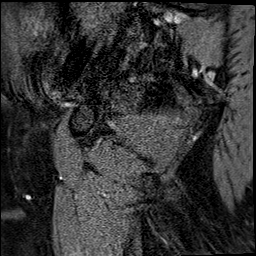

[20 of 40 positions shown; findings below may reference images not displayed]

FINDINGS: Bone

No hip fracture, dislocation or avascular necrosis. No aggressive
osseous lesion.

SI joints are normal. No SI joint widening or erosive changes.

Lower lumbar spine demonstrates no focal abnormality.

Alignment

Normal. No subluxation.  Twelve

Dysplasia

None.

Joint effusion

Intraarticular contrast distends the left hip joint capsule.
Otherwise, no joint effusions.

Labrum

No labral tear.

Cartilage

No full-thickness cartilage defect.

Capsule and ligaments

Normal.

Muscles and Tendons

Flexors: Normal.

Extensors: Normal.

Abductors: Normal.

Adductors: Normal.

Rotators: Normal.

Hamstrings: Normal.

Other Findings

No bursal fluid.

Viscera

No abnormality seen in pelvis. No lymphadenopathy. No free fluid in
the pelvis.
IMPRESSION: 1.  No evidence of fracture or dislocation.

2.  No evidence of labral tear or evidence of arthropathy.

3.  Muscles and tendons of the left hip are unremarkable.

## 2022-02-23 MED ORDER — LIDOCAINE HCL (PF) 1 % IJ SOLN
5.0000 mL | Freq: Once | INTRAMUSCULAR | Status: AC
  Administered 2022-02-23: 5 mL
  Filled 2022-02-23: qty 5

## 2022-02-23 MED ORDER — SODIUM CHLORIDE (PF) 0.9 % IJ SOLN
10.0000 mL | INTRAMUSCULAR | Status: DC | PRN
  Administered 2022-02-23: 7 mL

## 2022-02-23 MED ORDER — GADOBUTROL 1 MMOL/ML IV SOLN
0.0500 mL | Freq: Once | INTRAVENOUS | Status: AC | PRN
  Administered 2022-02-23: 0.05 mL

## 2022-02-23 MED ORDER — IOHEXOL 180 MG/ML  SOLN
20.0000 mL | Freq: Once | INTRAMUSCULAR | Status: AC | PRN
  Administered 2022-02-23: 13 mL

## 2022-03-23 ENCOUNTER — Ambulatory Visit: Payer: BC Managed Care – PPO | Admitting: Family Medicine

## 2022-03-29 NOTE — Progress Notes (Unsigned)
[facility] [place] Phone: [phone] Subjective:   Rebekah [name], am serving as a scribe for Dr. Hulan Saas.   I'm seeing this patient by the request  of:  [name], Rebekah Sledge, MD  CC: Low back and hip pain follow-up  [id number]  Rebekah [name] is a 31 y.o. female coming in with complaint of back and neck pain. OMT 02/20/2022. Patient states that her pain in lower back is getting worse. Pain is constant on right side. Patient wakes up at night. L hip is still catching and she does have tingling in lateral aspect.  Patient did have an MRI of the hip.  Did not show any significant bony or muscular difficulty.  Patient states now the pain does seem to be giving more discomfort in the back again.  Has tried some squatting after taking a few days off and her pain was less. Catching is less.   Medications patient has been prescribed:   Taking:  MRI L hip 02/23/2022 IMPRESSION: 1.  [name] evidence of fracture or dislocation.   2.  [name] evidence of labral tear or evidence of arthropathy.   3.  Muscles and tendons of the left hip are unremarkable.       Reviewed prior external information including notes and imaging from previsou exam, outside providers and external EMR if available.   As well as notes that were available from care everywhere and other healthcare systems.  Past medical history, social, surgical and family history all reviewed in electronic medical record.  [name] pertanent information unless stated regarding to the chief complaint.   [name] past medical history on file.  Allergies  Allergen Reactions   Sumatriptan Succinate Other (See Comments)    Muscle spasms     Review of Systems:  [name] headache, visual changes, nausea, vomiting, diarrhea, constipation, dizziness, abdominal pain, skin rash, fevers, chills, night sweats, weight loss, swollen lymph nodes, body aches, joint swelling, chest pain,  shortness of breath, mood changes. POSITIVE muscle aches  Objective  Blood pressure 124/82, pulse 66, height 5\' 1"  (1.549 m), weight 161 lb (73 kg), SpO2 99 %.   General: [name] apparent distress alert and oriented x3 mood and affect normal, dressed appropriately.  HEENT: Pupils equal, extraocular movements intact  Respiratory: Patient's speak in full sentences and does not appear short of breath  Cardiovascular: [name] lower extremity edema, non tender, [name] erythema  Patient does have some loss of lordosis.  Some tenderness to palpation in the paraspinal musculature.  Patient does have more tightness of the lumbar spine than usual.  Patient does have a positive Faber bilaterally.  Osteopathic findings  C2 flexed rotated and side bent right C5 flexed rotated and side bent left T3 extended rotated and side bent right inhaled rib T9 extended rotated and side bent left L2 flexed rotated and side bent right L5 flexed rotated and side bent right Sacrum left on left       Assessment and Plan:  Low back pain Low back exam does have some mild loss of lordosis.  Patient does have some tightness noted straight leg test bilaterally.  Concerned that patient may have exacerbation on more than L3-L4 likely contributing to some of the back pain.  Discussed icing regimen and home exercises.  We will order another epidural for this chronic problem with exacerbation.  Discussed which activities to do and which ones to avoid.  Follow-up again in 6 to 8  weeks    Nonallopathic problems  Decision today to treat with OMT was based on Physical Exam  After verbal consent patient was treated with HVLA, ME, FPR techniques in cervical, rib, thoracic, lumbar, and sacral  areas  Patient tolerated the procedure well with improvement in symptoms  Patient given exercises, stretches and lifestyle modifications  See medications in patient instructions if given  Patient will follow up in 4-8 weeks     The above  documentation has been reviewed and is accurate and complete Lyndal Pulley, DO        Note: This dictation was prepared with Dragon dictation along with smaller phrase technology. Any transcriptional errors that result from this process are unintentional.

## 2022-03-30 ENCOUNTER — Ambulatory Visit (INDEPENDENT_AMBULATORY_CARE_PROVIDER_SITE_OTHER): Payer: BC Managed Care – PPO | Admitting: Family Medicine

## 2022-03-30 VITALS — BP 124/82 | HR 66 | Ht 61.0 in | Wt 161.0 lb

## 2022-03-30 DIAGNOSIS — M9903 Segmental and somatic dysfunction of lumbar region: Secondary | ICD-10-CM | POA: Diagnosis not present

## 2022-03-30 DIAGNOSIS — M9904 Segmental and somatic dysfunction of sacral region: Secondary | ICD-10-CM

## 2022-03-30 DIAGNOSIS — M5442 Lumbago with sciatica, left side: Secondary | ICD-10-CM | POA: Diagnosis not present

## 2022-03-30 DIAGNOSIS — M9901 Segmental and somatic dysfunction of cervical region: Secondary | ICD-10-CM

## 2022-03-30 DIAGNOSIS — M5416 Radiculopathy, lumbar region: Secondary | ICD-10-CM

## 2022-03-30 DIAGNOSIS — M9902 Segmental and somatic dysfunction of thoracic region: Secondary | ICD-10-CM

## 2022-03-30 DIAGNOSIS — M9908 Segmental and somatic dysfunction of rib cage: Secondary | ICD-10-CM | POA: Diagnosis not present

## 2022-03-30 DIAGNOSIS — M5441 Lumbago with sciatica, right side: Secondary | ICD-10-CM

## 2022-03-30 NOTE — Patient Instructions (Signed)
L3/L4 epidural No changes See me 6 weeks after injection

## 2022-03-30 NOTE — Assessment & Plan Note (Addendum)
Low back exam does have some mild loss of lordosis.  Patient does have some tightness noted straight leg test bilaterally.  Concerned that patient may have exacerbation on more than L3-L4 likely contributing to some of the back pain.  Discussed icing regimen and home exercises.  We will order another epidural for this chronic problem with exacerbation.  Discussed which activities to do and which ones to avoid.  Follow-up again in 6 to 8 weeks

## 2022-04-04 ENCOUNTER — Ambulatory Visit
Admission: RE | Admit: 2022-04-04 | Discharge: 2022-04-04 | Disposition: A | Discharge status: Home / Self Care | Payer: BC Managed Care – PPO | Source: Ambulatory Visit | Attending: Family Medicine | Admitting: Family Medicine

## 2022-04-04 DIAGNOSIS — M5416 Radiculopathy, lumbar region: Secondary | ICD-10-CM

## 2022-04-04 IMAGING — XA Imaging study
3 series · 7 of 7 positions shown · non-contrast
Comparison: none

CLINICAL DATA: lumbar radiculopathy

[Series 1: standard · 1 of 1 slices shown (1 of 3)]
[im 1/1]
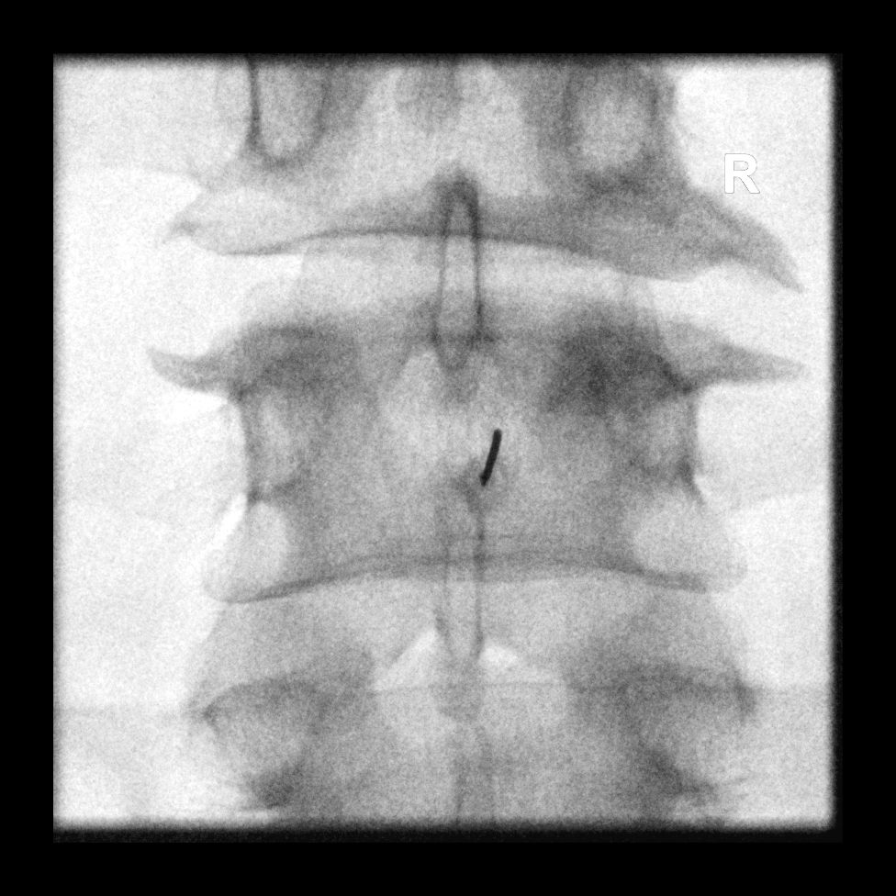

[Series 2: standard · 1 of 1 slices shown (2 of 3)]
[im 1/1]
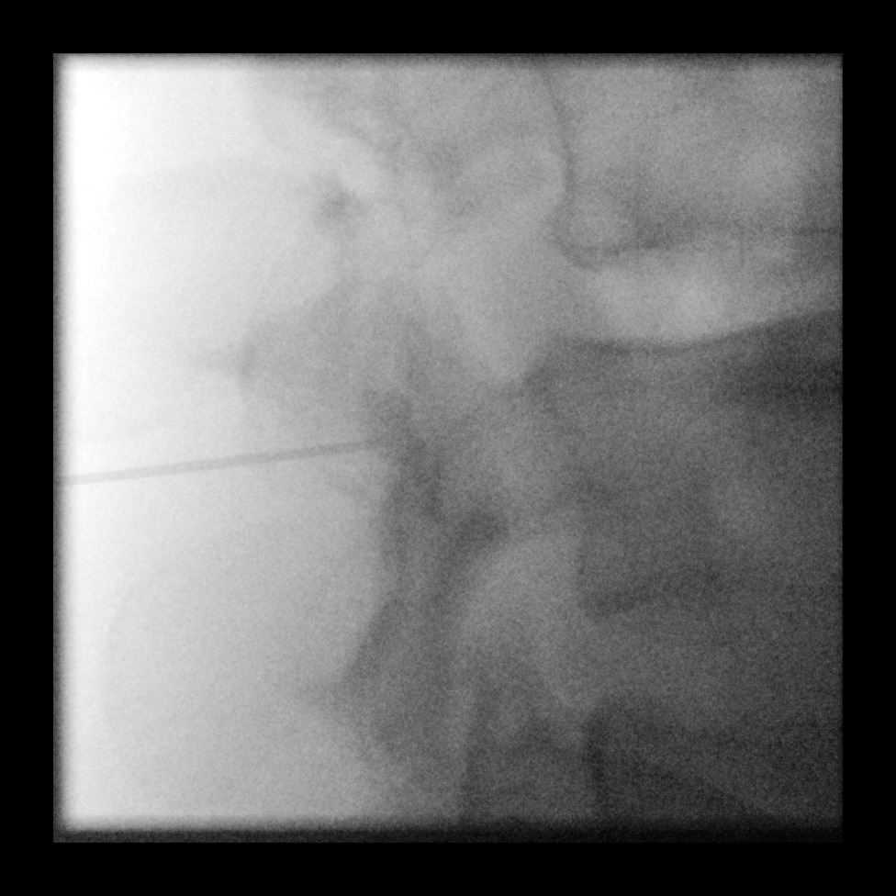

[Series 4: standard · 2 acquisitions, 5 frames shown (3 of 3)]
[im 1/2]
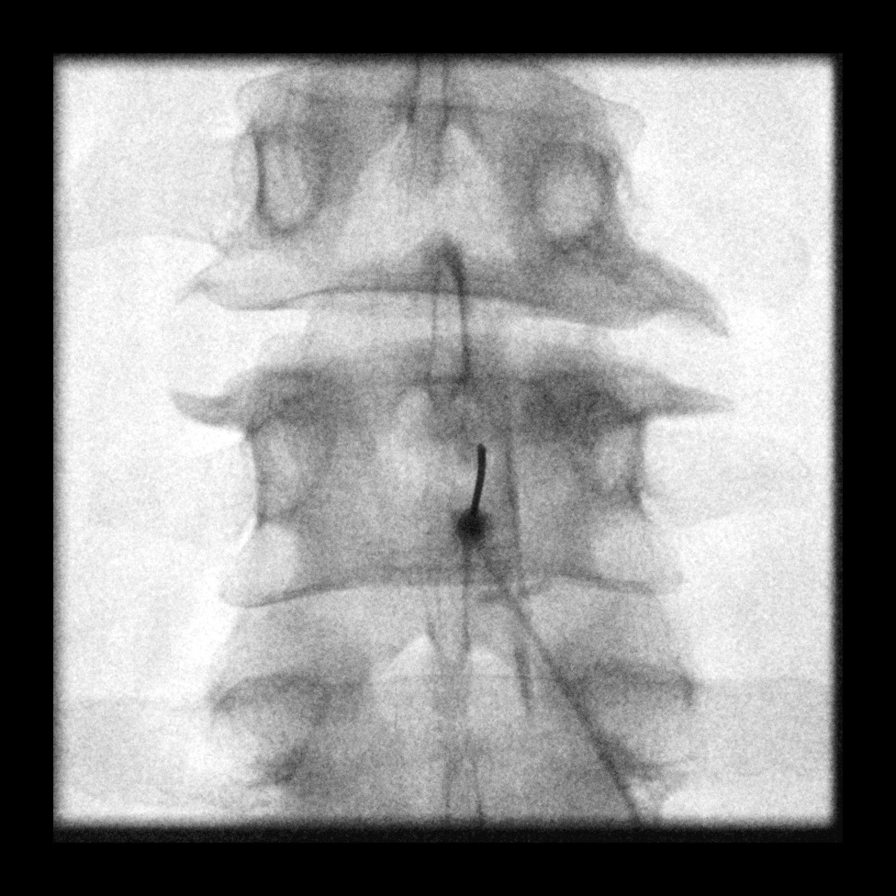
[im 1/2]
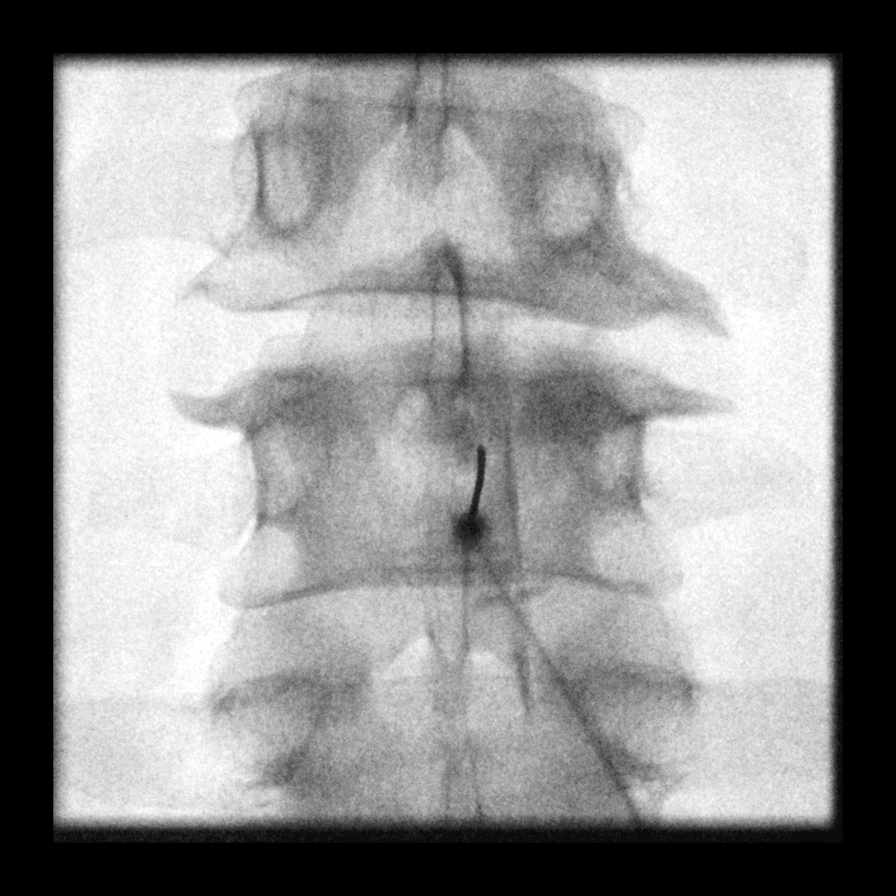
[im 1/2]
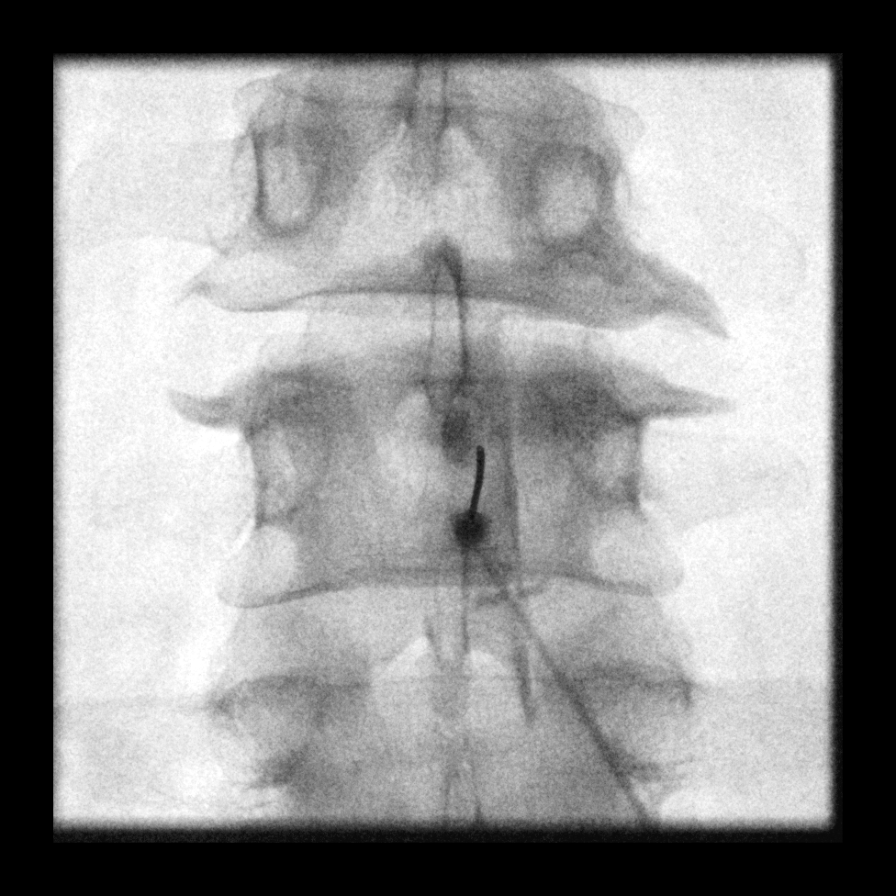
[im 1/2]
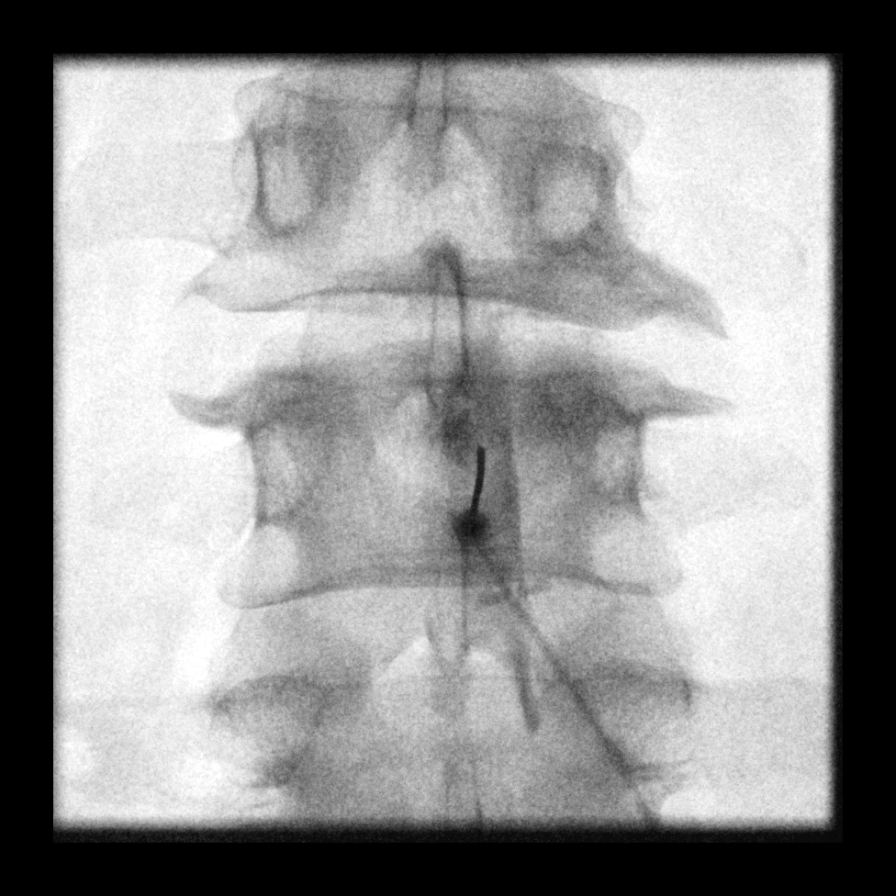
[im 2/2]
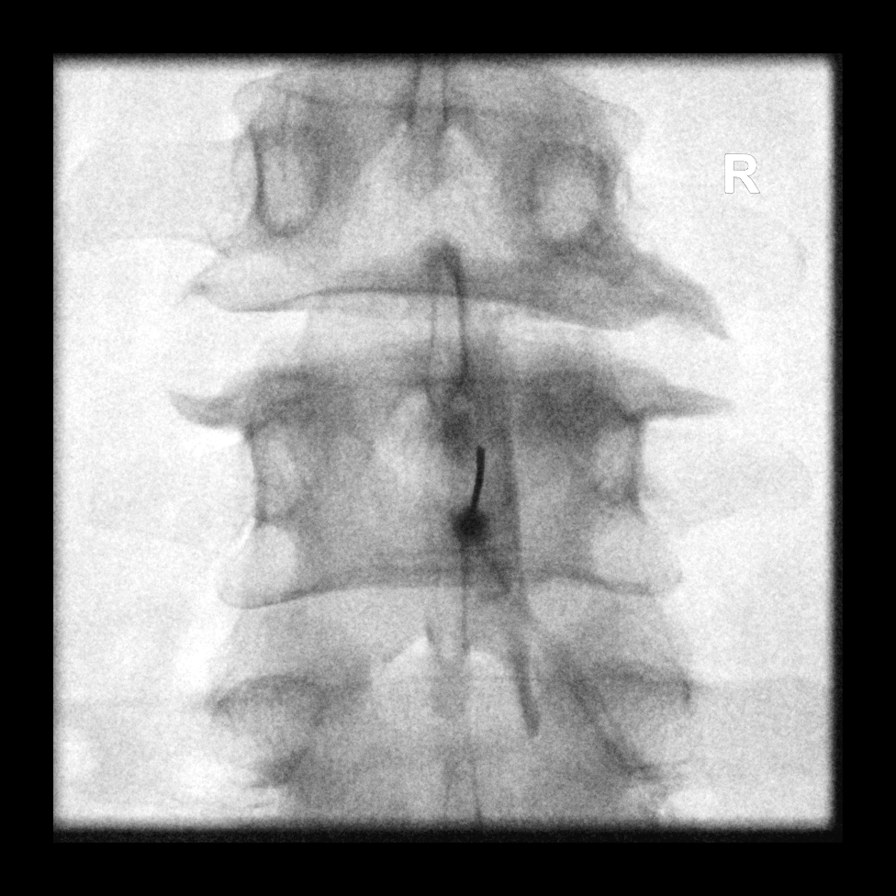

[7 of 7 positions shown; findings below may reference images not displayed]

FLUOROSCOPY:
Radiation Exposure Index (as provided by the fluoroscopic device):
0.6 minutes (3 mGy)

PROCEDURE:
The procedure, risks, benefits, and alternatives were explained to
the patient. Questions regarding the procedure were encouraged and
answered. The patient understands and consents to the procedure.

LUMBAR EPIDURAL INJECTION:

An interlaminar approach was performed on right at L3-L4. The
overlying skin was cleansed and anesthetized. A 20 gauge epidural
needle was advanced using loss-of-resistance technique.

DIAGNOSTIC EPIDURAL INJECTION:

Injection of Isovue-M 200 shows a good epidural pattern with spread
above and below the level of needle placement, primarily on the
right. No vascular opacification is seen.

THERAPEUTIC EPIDURAL INJECTION:

80 Mg of Depo-Medrol mixed with 2 mL 1% lidocaine were instilled.
The procedure was well-tolerated, and the patient was discharged
thirty minutes following the injection in good condition.

COMPLICATIONS:
None immediate
IMPRESSION: Technically successful epidural steroid injection at L3-L4 via
interlaminar approach.

## 2022-04-04 MED ORDER — IOPAMIDOL (ISOVUE-M 200) INJECTION 41%
1.0000 mL | Freq: Once | INTRAMUSCULAR | Status: AC
  Administered 2022-04-04: 1 mL via EPIDURAL

## 2022-04-04 MED ORDER — METHYLPREDNISOLONE ACETATE 40 MG/ML INJ SUSP (RADIOLOG
80.0000 mg | Freq: Once | INTRAMUSCULAR | Status: AC
  Administered 2022-04-04: 80 mg via EPIDURAL

## 2022-04-04 NOTE — Discharge Instructions (Signed)

## 2022-04-12 ENCOUNTER — Ambulatory Visit: Payer: BC Managed Care – PPO | Admitting: Family Medicine

## 2022-04-13 ENCOUNTER — Ambulatory Visit: Payer: BC Managed Care – PPO | Admitting: Family Medicine

## 2022-05-15 NOTE — Progress Notes (Unsigned)
[name] [address] [place] [address] Phone: [phone] Subjective:   [name], am serving as a scribe for Dr. Antoine Primas.  I'm seeing this patient by the request  of:  [name], [name], MD  CC: Low back pain follow-up  [id number]  Rebekah [name] is a 31 y.o. female coming in with complaint of back and neck pain. OMT 03/30/2022. Patient states same per usual. No new complaints.  Patient was seen previously and did have a epidural done on June 6.  Since then has slowly been getting better again.  Patient states that he is making some improvement.  Still has pain on a daily basis though.  Medications patient has been prescribed: None         Reviewed prior external information including notes and imaging from previsou exam, outside providers and external EMR if available.   As well as notes that were available from care everywhere and other healthcare systems.  Past medical history, social, surgical and family history all reviewed in electronic medical record.  No pertanent information unless stated regarding to the chief complaint.   No past medical history on file.  Allergies  Allergen Reactions   Sumatriptan Succinate Other (See Comments)    Muscle spasms     Review of Systems:  No headache, visual changes, nausea, vomiting, diarrhea, constipation, dizziness, abdominal pain, skin rash, fevers, chills, night sweats, weight loss, swollen lymph nodes,  joint swelling, chest pain, shortness of breath, mood changes. POSITIVE muscle aches, body aches  Objective  Blood pressure 116/78, pulse (!) 114, height 5\' 1"  (1.549 m), weight 166 lb (75.3 kg), SpO2 97 %.   General: No apparent distress alert and oriented x3 mood and affect normal, dressed appropriately.  HEENT: Pupils equal, extraocular movements intact  Respiratory: Patient's speak in full sentences and does not appear short of breath  Cardiovascular:  No lower extremity edema, non tender, no erythema  Gait MSK:  Back low back exam does have some loss of lordosis.  Still has some tightness noted in the paraspinal musculature right greater than left.  Patient does have a positive on the right side.  Negative straight leg test.  Patient is able to tolerate palpation which is improvement from previous exam.  Osteopathic findings  C2 flexed rotated and side bent right C4 flexed rotated and side bent left T9 extended rotated and side bent left with inhaled rib L2 flexed rotated and side bent right L3 flexed rotated and side bent left Sacrum right on right       Assessment and Plan:  Low back pain Patient has improved since the last epidural.  Was able to get more movement than previously.  Has muscle relaxers for breakthrough if necessary.  Discussed icing regimen and home exercises.  Discussed posture and ergonomics.  Follow-up again in 6 to 8 weeks.    Nonallopathic problems  Decision today to treat with OMT was based on Physical Exam  After verbal consent patient was treated with HVLA, ME, FPR techniques in cervical, rib, thoracic, lumbar, and sacral  areas  Patient tolerated the procedure well with improvement in symptoms  Patient given exercises, stretches and lifestyle modifications  See medications in patient instructions if given  Patient will follow up in 4-8 weeks     The above documentation has been reviewed and is accurate and complete [name], DO         Note: This dictation  was prepared with Dragon dictation along with smaller phrase technology. Any transcriptional errors that result from this process are unintentional.

## 2022-05-16 ENCOUNTER — Ambulatory Visit (INDEPENDENT_AMBULATORY_CARE_PROVIDER_SITE_OTHER): Payer: BC Managed Care – PPO | Admitting: Family Medicine

## 2022-05-16 VITALS — BP 116/78 | HR 114 | Ht 61.0 in | Wt 166.0 lb

## 2022-05-16 DIAGNOSIS — M9908 Segmental and somatic dysfunction of rib cage: Secondary | ICD-10-CM

## 2022-05-16 DIAGNOSIS — M9903 Segmental and somatic dysfunction of lumbar region: Secondary | ICD-10-CM

## 2022-05-16 DIAGNOSIS — M5442 Lumbago with sciatica, left side: Secondary | ICD-10-CM

## 2022-05-16 DIAGNOSIS — M9904 Segmental and somatic dysfunction of sacral region: Secondary | ICD-10-CM | POA: Diagnosis not present

## 2022-05-16 DIAGNOSIS — M9901 Segmental and somatic dysfunction of cervical region: Secondary | ICD-10-CM

## 2022-05-16 DIAGNOSIS — M9902 Segmental and somatic dysfunction of thoracic region: Secondary | ICD-10-CM | POA: Diagnosis not present

## 2022-05-16 DIAGNOSIS — M5441 Lumbago with sciatica, right side: Secondary | ICD-10-CM

## 2022-05-16 NOTE — Patient Instructions (Signed)
Good to see you! Glad you're feeling better after injection See you again in 6-8 weeks

## 2022-05-16 NOTE — Assessment & Plan Note (Signed)
Patient has improved since the last epidural.  Was able to get more movement than previously.  Has muscle relaxers for breakthrough if necessary.  Discussed icing regimen and home exercises.  Discussed posture and ergonomics.  Follow-up again in 6 to 8 weeks.

## 2022-05-22 ENCOUNTER — Other Ambulatory Visit: Payer: Self-pay | Admitting: Family Medicine

## 2022-05-22 MED ORDER — ONDANSETRON HCL 4 MG PO TABS: 4.0000 mg | ORAL_TABLET | Freq: Three times a day (TID) | ORAL | 0 refills | Status: DC | PRN

## 2022-05-22 MED ORDER — CYCLOBENZAPRINE HCL 5 MG PO TABS: 5.0000 mg | ORAL_TABLET | Freq: Two times a day (BID) | ORAL | 0 refills | Status: DC | PRN

## 2022-06-28 NOTE — Progress Notes (Signed)
  [name] [address] [place] [address] Phone: [phone] Subjective:   Rebekah [name], am serving as a scribe for Dr. Antoine Primas.  I'm seeing this patient by the request  of:  [name], Rebekah Sale, MD  CC: Neck and back pain follow-up  [id number]  Rebekah [name] Deryl Ports is a 31 y.o. female coming in with complaint of back and neck pain. OMT 05/16/2022. Patient states that lower right back has been increasingly bothering her and underneath right shoulder blade has been catching. Patient also needs a refill on the zofran   Medications patient has been prescribed: Flexeril, Zofran  Taking: flexeril zofran        No past medical history on file.  Allergies  Allergen Reactions   Sumatriptan Succinate Other (See Comments)    Muscle spasms     Review of Systems:  No headache, visual changes, nausea, vomiting, diarrhea, constipation, dizziness, abdominal pain, skin rash, fevers, chills, night sweats, weight loss, swollen lymph nodes, joint swelling, chest pain, shortness of breath, mood changes. POSITIVE muscle aches, body aches  Objective  Blood pressure 130/86, pulse 78, height 5\' 1"  (1.549 m), weight 161 lb (73 kg), SpO2 98 %.   General: No apparent distress alert and oriented x3 mood and affect normal, dressed appropriately.  HEENT: Pupils equal, extraocular movements intact  Respiratory: Patient's speak in full sentences and does not appear short of breath  Cardiovascular: No lower extremity edema, non tender, no erythema  Gait MSK:  Back does have some mild loss of lordosis.  Tightness with FABER test bilaterally.  Tenderness to palpation over the sacroiliac joint bilaterally.  Osteopathic findings  C3 flexed rotated and side bent right C5 flexed rotated and side bent left T3 extended rotated and side bent right inhaled rib T9 extended rotated and side bent left L2 flexed rotated and side bent right L5 flexed  rotated and side bent left Sacrum right on right    Assessment and Plan:  Low back pain Continue to have tightness noted. Discussed HEp  Discussed continue core strength.  Follow-up with me again in 6 weeks    Nonallopathic problems  Decision today to treat with OMT was based on Physical Exam  After verbal consent patient was treated with HVLA, ME, FPR techniques in cervical, rib, thoracic, lumbar, and sacral  areas  Patient tolerated the procedure well with improvement in symptoms  Patient given exercises, stretches and lifestyle modifications  See medications in patient instructions if given  Patient will follow up in 4-8 weeks    The above documentation has been reviewed and is accurate and complete , DO          Note: This dictation was prepared with Dragon dictation along with smaller phrase technology. Any transcriptional errors that result from this process are unintentional.

## 2022-07-07 ENCOUNTER — Ambulatory Visit (INDEPENDENT_AMBULATORY_CARE_PROVIDER_SITE_OTHER): Payer: BC Managed Care – PPO | Admitting: Family Medicine

## 2022-07-07 ENCOUNTER — Encounter: Payer: Self-pay | Admitting: Family Medicine

## 2022-07-07 VITALS — BP 130/86 | HR 78 | Ht 61.0 in | Wt 161.0 lb

## 2022-07-07 DIAGNOSIS — M9904 Segmental and somatic dysfunction of sacral region: Secondary | ICD-10-CM | POA: Diagnosis not present

## 2022-07-07 DIAGNOSIS — M5441 Lumbago with sciatica, right side: Secondary | ICD-10-CM

## 2022-07-07 DIAGNOSIS — M5442 Lumbago with sciatica, left side: Secondary | ICD-10-CM

## 2022-07-07 DIAGNOSIS — M9908 Segmental and somatic dysfunction of rib cage: Secondary | ICD-10-CM

## 2022-07-07 DIAGNOSIS — M9902 Segmental and somatic dysfunction of thoracic region: Secondary | ICD-10-CM

## 2022-07-07 DIAGNOSIS — M9901 Segmental and somatic dysfunction of cervical region: Secondary | ICD-10-CM

## 2022-07-07 DIAGNOSIS — M9903 Segmental and somatic dysfunction of lumbar region: Secondary | ICD-10-CM | POA: Diagnosis not present

## 2022-07-07 MED ORDER — ONDANSETRON HCL 4 MG PO TABS: 4.0000 mg | ORAL_TABLET | Freq: Three times a day (TID) | ORAL | 0 refills | Status: DC | PRN

## 2022-07-07 NOTE — Patient Instructions (Addendum)
Good to see you  Over all making improvement Follow up in 6 weeks

## 2022-07-07 NOTE — Assessment & Plan Note (Signed)
Continue to have tightness noted. Discussed HEp  Discussed continue core strength.  Follow-up with me again in 6 weeks

## 2022-07-24 ENCOUNTER — Encounter: Payer: Self-pay | Admitting: Family Medicine

## 2022-08-17 NOTE — Progress Notes (Signed)
[name] [address] [place] [address] Phone: [phone] Subjective:   Rebekah [name], am serving as a scribe for Dr. Antoine Primas.  I'm seeing this patient by the request  of:  [name], [name], MD  CC: back and neck pain   [id number]  [name] is a 31 y.o. female coming in with complaint of back and neck pain. OMT 07/07/2022. Patient states back is hurting today more than usual. Re-injured left ankle about a month ago. Would like for you to take a look. No other issues.  Medications patient has been prescribed: Zofran  Taking:         Reviewed prior external information including notes and imaging from previsou exam, outside providers and external EMR if available.   As well as notes that were available from care everywhere and other healthcare systems.  Past medical history, social, surgical and family history all reviewed in electronic medical record.  No pertanent information unless stated regarding to the chief complaint.   No past medical history on file.  Allergies  Allergen Reactions   Sumatriptan Succinate Other (See Comments)    Muscle spasms     Review of Systems:  No headache, visual changes, nausea, vomiting, diarrhea, constipation, dizziness, abdominal pain, skin rash, fevers, chills, night sweats, weight loss, swollen lymph nodes, body aches, joint swelling, chest pain, shortness of breath, mood changes. POSITIVE muscle aches  Objective  Blood pressure 118/76, pulse (!) 103, height 5\' 1"  (1.549 m), weight 173 lb (78.5 kg), SpO2 99 %.   General: No apparent distress alert and oriented x3 mood and affect normal, dressed appropriately.  HEENT: Pupils equal, extraocular movements intact  Respiratory: Patient's speak in full sentences and does not appear short of breath  Cardiovascular: No lower extremity edema, non tender, no erythema  Antalgic gait noted. Favoring the left ankle  Left  ankle does have severe tenderness to palpation on the posterior inferior lateral malleolus.  No masses appreciated no.  Patient is tender to palpation of the ATFL as well.  Patient does have voluntary guarding noted of the ankle.  Limited muscular skeletal ultrasound was performed and interpreted by , M   Limited ultrasound of the ankle shows that patient does have a possible split tear that seems to be acute near the insertion of the fifth metatarsal.  In addition to this though there is a cortical irregularity noted at the posterior inferior aspect of the lateral malleolus.  We cannot tell if it is an effusion.  Some increasing in neovascularization in the area.  No masses appreciated.  Osteopathic findings  C2 flexed rotated and side bent right T5 extended rotated and side bent right inhaled rib T8 extended rotated and side bent left L1 flexed rotated and side bent right Sacrum right on right       Assessment and Plan:  Left ankle pain Chronic pain with recurrent injury to peroneal tendon.   Patient continues to have lateral column overload on the side as well.  Patient has reinjured this ankle multiple times.  Aircast given, heel lift given for now.  Patient is ambulatory but is looking for more long-term improvement.  On ultrasound appears to have a very small avulsion or bony prominence on the posterior inferior aspect of the lateral malleolus that could also be contributing to the severity of the pain and instability the patient is having.  Medications patient does have hydrocodone from another provider who can  continue to take it every 6 hours but hopefully she is not taking it that often.  Does take cyclobenzaprine 5 mg 2 times daily as needed.  Follow-up with me again in 6 to 8 weeks for her back.  We discussed though that depending on the imaging this could change management but patient would like referral to orthopedic surgery to discuss other more invasive  intervention.    Nonallopathic problems  Decision today to treat with OMT was based on Physical Exam  After verbal consent patient was treated with HVLA, ME, FPR techniques in cervical, rib, thoracic, lumbar, and sacral  areas  Patient tolerated the procedure well with improvement in symptoms  Patient given exercises, stretches and lifestyle modifications  See medications in patient instructions if given  Patient will follow up in 4-8 weeks    The above documentation has been reviewed and is accurate and complete Lyndal Pulley, DO          Note: This dictation was prepared with Dragon dictation along with smaller phrase technology. Any transcriptional errors that result from this process are unintentional.

## 2022-08-18 ENCOUNTER — Encounter: Payer: Self-pay | Admitting: Family Medicine

## 2022-08-18 ENCOUNTER — Ambulatory Visit (INDEPENDENT_AMBULATORY_CARE_PROVIDER_SITE_OTHER): Payer: BC Managed Care – PPO

## 2022-08-18 ENCOUNTER — Ambulatory Visit (INDEPENDENT_AMBULATORY_CARE_PROVIDER_SITE_OTHER): Payer: BC Managed Care – PPO | Admitting: Family Medicine

## 2022-08-18 ENCOUNTER — Ambulatory Visit: Payer: Self-pay

## 2022-08-18 VITALS — BP 118/76 | HR 103 | Ht 61.0 in | Wt 173.0 lb

## 2022-08-18 DIAGNOSIS — M9902 Segmental and somatic dysfunction of thoracic region: Secondary | ICD-10-CM

## 2022-08-18 DIAGNOSIS — M5442 Lumbago with sciatica, left side: Secondary | ICD-10-CM

## 2022-08-18 DIAGNOSIS — M9908 Segmental and somatic dysfunction of rib cage: Secondary | ICD-10-CM | POA: Diagnosis not present

## 2022-08-18 DIAGNOSIS — M9903 Segmental and somatic dysfunction of lumbar region: Secondary | ICD-10-CM | POA: Diagnosis not present

## 2022-08-18 DIAGNOSIS — M5441 Lumbago with sciatica, right side: Secondary | ICD-10-CM | POA: Diagnosis not present

## 2022-08-18 DIAGNOSIS — M9904 Segmental and somatic dysfunction of sacral region: Secondary | ICD-10-CM

## 2022-08-18 DIAGNOSIS — M25572 Pain in left ankle and joints of left foot: Secondary | ICD-10-CM | POA: Diagnosis not present

## 2022-08-18 DIAGNOSIS — M9901 Segmental and somatic dysfunction of cervical region: Secondary | ICD-10-CM

## 2022-08-18 NOTE — Patient Instructions (Signed)
L ankle xray MRI L ankle [place] Referral to [facility] See me in 2-3 months

## 2022-08-18 NOTE — Assessment & Plan Note (Signed)
Chronic, with exacerbation secondary to patient having difficulty with her ankle.  Discussed with patient about icing regimen and home exercises.  Zanaflex 2 mg at night still.  Follow-up again 6 to 8 weeks.

## 2022-08-18 NOTE — Assessment & Plan Note (Signed)
Chronic pain with recurrent injury to peroneal tendon.   Patient continues to have lateral column overload on the side as well.  Patient has reinjured this ankle multiple times.  Aircast given, heel lift given for now.  Patient is ambulatory but is looking for more long-term improvement.  On ultrasound appears to have a very small avulsion or bony prominence on the posterior inferior aspect of the lateral malleolus that could also be contributing to the severity of the pain and instability the patient is having.  Medications patient does have hydrocodone from another provider who can continue to take it every 6 hours but hopefully she is not taking it that often.  Does take cyclobenzaprine 5 mg 2 times daily as needed.  Follow-up with me again in 6 to 8 weeks for her back.  We discussed though that depending on the imaging this could change management but patient would like referral to orthopedic surgery to discuss other more invasive intervention.

## 2022-08-25 ENCOUNTER — Other Ambulatory Visit: Payer: Self-pay | Admitting: Orthopaedic Surgery

## 2022-09-03 ENCOUNTER — Other Ambulatory Visit: Payer: BC Managed Care – PPO

## 2022-09-06 ENCOUNTER — Ambulatory Visit
Admission: RE | Admit: 2022-09-06 | Discharge: 2022-09-06 | Disposition: A | Discharge status: Home / Self Care | Payer: BC Managed Care – PPO | Source: Ambulatory Visit | Attending: Family Medicine | Admitting: Family Medicine

## 2022-09-06 DIAGNOSIS — M25572 Pain in left ankle and joints of left foot: Secondary | ICD-10-CM

## 2022-09-09 ENCOUNTER — Encounter: Payer: Self-pay | Admitting: Family Medicine

## 2022-09-23 ENCOUNTER — Encounter: Payer: Self-pay | Admitting: Emergency Medicine

## 2022-09-23 ENCOUNTER — Ambulatory Visit (INDEPENDENT_AMBULATORY_CARE_PROVIDER_SITE_OTHER): Payer: BC Managed Care – PPO

## 2022-09-23 ENCOUNTER — Ambulatory Visit
Admission: EM | Admit: 2022-09-23 | Discharge: 2022-09-23 | Disposition: A | Discharge status: Home / Self Care | Payer: BC Managed Care – PPO | Attending: Physician Assistant | Admitting: Physician Assistant

## 2022-09-23 DIAGNOSIS — M79661 Pain in right lower leg: Secondary | ICD-10-CM

## 2022-09-23 DIAGNOSIS — M25571 Pain in right ankle and joints of right foot: Secondary | ICD-10-CM | POA: Diagnosis not present

## 2022-09-23 DIAGNOSIS — M79671 Pain in right foot: Secondary | ICD-10-CM

## 2022-09-23 NOTE — Discharge Instructions (Addendum)
SPRAIN: Stressed avoiding painful activities . Reviewed RICE guidelines. Use medications as directed, including NSAIDs. If no NSAIDs have been prescribed for you today, you may take Aleve or Motrin over the counter. May use Tylenol in between doses of NSAIDs.  If no improvement in the next 1-2 weeks, f/u with PCP or return to our office for reexamination, and please feel free to call or return at any time for any questions or concerns you may have and we will be happy to help you!      X-rays all negative.  Follow-up with your orthopedist if symptoms do not improve in the next few days.

## 2022-09-23 NOTE — ED Triage Notes (Signed)
Pt c/o right foot pain. Started yesterday. Pt states she woke up in the middle of the night and jumped out of bed and hit her foot on the baseboard on the wall. She states majority of the pain is in her heel and goes up her right calve.

## 2022-09-23 NOTE — ED Provider Notes (Signed)
[facility]    CSN: [id number] Arrival date & time: 09/23/22  0805      History   Chief Complaint Chief Complaint  Patient presents with   Foot Pain    right    HPI Rebekah [name] is a 31 y.o. female presenting for pain of the right foot, ankle and lower leg.  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  She says that her foot hit the baseboard and she was somehow caught between the wall and her bed.  She says she has difficulty bearing weight on the right lower extremity due to significant pain.  Patient says that she has not taken anything for pain.  She has also not applied any ice to the area.  She denies any numbness, weakness or tingling.  Patient is being treated by orthopedics for a chronic problems in her left lower extremity.  Patient has no other complaints or injuries to report.  HPI  History reviewed. No pertinent past medical history.  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      Home Medications    Prior to Admission medications   Medication Sig Start Date End Date Taking? Authorizing Provider  albuterol (VENTOLIN HFA) 108 (90 Base) MCG/ACT inhaler INHALE 2 PUFFS BY MOUTH EVERY 6 HOURS ASNEEDED WHEEZING 01/09/19  Yes [provider]  budesonide-formoterol (SYMBICORT) 80-4.5 MCG/ACT inhaler Inhale into the lungs.  11/05/19  Yes [provider]  Vitamin D, Ergocalciferol, (DRISDOL) 1.25 MG (50000 UNIT) CAPS capsule Take 1 capsule (50,000 Units total) by mouth every 7 (seven) days. 06/17/21  Yes Judi Saa, DO  cyclobenzaprine (FLEXERIL) 5 MG tablet Take 1 tablet (5 mg total) by mouth 2 (two) times daily as needed for muscle spasms. 05/22/22   Judi Saa, DO  HYDROcodone-acetaminophen (NORCO) 10-325 MG tablet Take 1 tablet by mouth every 6 (six) hours as needed.    [provider]  ondansetron (ZOFRAN) 4 MG tablet Take 1 tablet (4 mg total) by mouth every 8 (eight) hours as needed for nausea or vomiting. 07/07/22   Judi Saa, DO  predniSONE (DELTASONE) 20 MG tablet Take 1 tablet (20 mg total) by mouth daily with breakfast. 10/05/21   Judi Saa, DO  tiZANidine (ZANAFLEX) 2 MG tablet Take 1 tablet (2 mg total) by mouth at bedtime. 10/05/21   Judi Saa, DO    Family History No family history on file.  Social History Social History   Tobacco Use   Smoking status: Never   Smokeless tobacco: Never  Vaping Use   Vaping Use: Never used  Substance Use Topics   Alcohol use: Yes   Drug use: Not Currently     Allergies   Sumatriptan succinate   Review of Systems  Review of Systems  Musculoskeletal:  Positive for arthralgias, gait problem and joint swelling.  Skin:  Negative for color change and wound.  Neurological:  Negative for weakness and numbness.     Physical Exam Triage Vital Signs ED Triage Vitals  Enc Vitals Group     BP 05/17/21 1020 (!) 141/85     Pulse Rate 05/17/21 1020 96     Resp 05/17/21 1020 18     Temp 05/17/21 1020 98.5 F (36.9 C)     Temp Source 05/17/21 1020 Oral     SpO2 05/17/21 1020 100 %     Weight 05/17/21 1018 192 lb 0.3 oz (87.1 kg)     Height 05/17/21 1018 5\' 1"  (1.549 m)     Head Circumference --      Peak Flow --      Pain Score 05/17/21 1018 10     Pain Loc --      Pain Edu? --      Excl. in GC? --    No data  found.  Updated Vital Signs BP 117/79 (BP Location: Right Arm)   Pulse 69   Temp 99 F (37.2 C) (Oral)   Resp 16   Ht 5\' 1"  (1.549 m)   Wt 173 lb 1 oz (78.5 kg)   LMP 09/14/2022 (Approximate)   SpO2 98%   BMI 32.70 kg/m       Physical Exam Vitals and nursing note reviewed.  Constitutional:      General: She is not in acute distress.    Appearance: Normal appearance. She is not ill-appearing or toxic-appearing.  HENT:     Head: Normocephalic and atraumatic.  Eyes:     General: No scleral icterus.       Right eye: No discharge.        Left eye: No discharge.     Conjunctiva/sclera: Conjunctivae normal.  Cardiovascular:     Rate and Rhythm: Normal rate and regular rhythm.     Pulses: Normal pulses.  Pulmonary:     Effort: Pulmonary effort is normal. No respiratory distress.  Musculoskeletal:     Cervical back: Neck supple.     Right foot: Decreased range of motion. Swelling (mild/moderate swelling lateral ankle) and bony tenderness (medial malleolus, calcaneus, and lateral malleolus as well as ATFL) present. Normal pulse.     Comments: Additionally has tenderness along the lateral lower leg and proximal fibula  Skin:    General: Skin is dry.  Neurological:     General: No focal deficit present.     Mental Status: She is alert. Mental status is at baseline.     Motor: No weakness.     Gait: Gait abnormal.  Psychiatric:        Mood and Affect: Mood normal.        Behavior: Behavior normal.        Thought Content: Thought content normal.      UC Treatments / Results  Labs (all labs ordered are listed, but only abnormal results are displayed) Labs Reviewed - No data to display  EKG   Radiology DG Tibia/Fibula Right  Result Date: 09/23/2022 CLINICAL DATA:  31 year old female status post fall with pain. EXAM: RIGHT TIBIA AND FIBULA - 2 VIEW COMPARISON:  Right foot and ankle series today reported separately. FINDINGS: Bone mineralization is within normal limits.  There is no evidence of fracture or other focal bone lesions. Soft tissues are unremarkable. IMPRESSION: Negative. Electronically Signed   By:  Odessa Fleming M.D.   On: 09/23/2022 09:12   DG Ankle Complete Right  Result Date: 09/23/2022 CLINICAL DATA:  Injured right ankle last evening. EXAM: RIGHT ANKLE - COMPLETE 3+ VIEW COMPARISON:  None Available. FINDINGS: The ankle mortise is maintained. No acute ankle fracture. No osteochondral lesion. No joint effusion. The mid and hindfoot bony structures are intact. A calcaneal heel spurs noted. Incidental os trigonum. IMPRESSION: No acute bony findings. Electronically Signed   By: Rudie Meyer M.D.   On: 09/23/2022 09:01   DG Foot Complete Right  Result Date: 09/23/2022 CLINICAL DATA:  Injured right foot. EXAM: RIGHT FOOT COMPLETE - 3+ VIEW COMPARISON:  06/17/2021 FINDINGS: The joint spaces are maintained. No acute fracture is identified. Mild pes cavus noted. Moderate-sized calcaneal heel spur. IMPRESSION: No acute bony findings. Electronically Signed   By: Rudie Meyer M.D.   On: 09/23/2022 09:00    Procedures Procedures (including critical care time)  Medications Ordered in UC Medications - No data to display  Initial Impression / Assessment and Plan / UC Course  I have reviewed the triage vital signs and the nursing notes.  Pertinent labs & imaging results that were available during my care of the patient were reviewed by me and considered in my medical decision making (see chart for details).   31 y/o female presenting for pain of right calcaneus, ankle and lower leg after a fall last night.  X-rays  of foot, ankle and tib-fib obtained.    X-rays do not reveal and fractures. Reviewed results with patient.  Advised CAM boot and crutches since she is reporting difficulty bearing weight.  Advised her I cannot rule out ligamentous tears at this time and she should follow-up with her orthopedist if she is not starting to improve pretty quickly over the  next week.  Reviewed RICE guidelines, OTC meds for pain relief.  Advised following up with orthopedics.  ED precautions given.    Final Clinical Impressions(s) / UC Diagnoses   Final diagnoses:  Right foot pain  Acute right ankle pain  Pain in right lower leg     Discharge Instructions      SPRAIN: Stressed avoiding painful activities . Reviewed RICE guidelines. Use medications as directed, including NSAIDs. If no NSAIDs have been prescribed for you today, you may take Aleve or Motrin over the counter. May use Tylenol in between doses of NSAIDs.  If no improvement in the next 1-2 weeks, f/u with PCP or return to our office for reexamination, and please feel free to call or return at any time for any questions or concerns you may have and we will be happy to help you!      X-rays all negative.  Follow-up with your orthopedist if symptoms do not improve in the next few days.       ED Prescriptions   None    I have reviewed the PDMP during this encounter.     Shirlee Latch, PA-C 09/23/22 [phone]

## 2022-10-12 NOTE — Progress Notes (Signed)
[name] [address] [place] [address] Phone: [phone] Subjective:   [name], am serving as a scribe for Dr. Antoine Primas.  I'm seeing this patient by the request  of:  [name], [name], MD  CC: Left ankle pain  [id number]  08/18/2022 Chronic pain with recurrent injury to peroneal tendon.   Patient continues to have lateral column overload on the side as well.  Patient has reinjured this ankle multiple times.  Aircast given, heel lift given for now.  Patient is ambulatory but is looking for more long-term improvement.  On ultrasound appears to have a very small avulsion or bony prominence on the posterior inferior aspect of the lateral malleolus that could also be contributing to the severity of the pain and instability the patient is having.  Medications patient does have hydrocodone from another provider who can continue to take it every 6 hours but hopefully she is not taking it that often.  Does take cyclobenzaprine 5 mg 2 times daily as needed.  Follow-up with me again in 6 to 8 weeks for her back.  We discussed though that depending on the imaging this could change management but patient would like referral to orthopedic surgery to discuss other more invasive intervention.   Update 10/17/2022 Rebekah [name] is a 31 y.o. female coming in with complaint of L ankle pain. Patient suffered a right ankle sprain since last visit.  Patient was seen in the emergency room and further evaluation with radiologic imaging showed patient does have a moderate heel spur but otherwise unremarkable.  Emergency room visit was November 25.  Patient states that she had EMG last week. Pain is the same as last visit in L ankle.   Regarding patient's left ankle patient did have an MRI showing that patient did have partial tearing noted of the ATFL and partial thickness tearing of the deltoid ligament.  Patient also having R ankle after  spraining it a few weeks ago. Pain in ankle has improved but pain still persists in heel. Pain is much less than it was.     No past medical history on file. Past Surgical History:  Procedure Laterality Date   WISDOM TOOTH EXTRACTION     Social History   Socioeconomic History   Marital status: Married    Spouse name: Not on file   Number of children: Not on file   Years of education: Not on file   Highest education level: Not on file  Occupational History   Not on file  Tobacco Use   Smoking status: Never   Smokeless tobacco: Never  Vaping Use   Vaping Use: Never used  Substance and Sexual Activity   Alcohol use: Yes   Drug use: Not Currently   Sexual activity: Not on file  Other Topics Concern   Not on file  Social History Narrative   ** Merged History Encounter **       Social Determinants of Health   Financial Resource Strain: Not on file  Food Insecurity: Not on file  Transportation Needs: Not on file  Physical Activity: Not on file  Stress: Not on file  Social Connections: Not on file   Allergies  Allergen Reactions   Sumatriptan Succinate Other (See Comments)    Muscle spasms   No family history on file.  Current Outpatient Medications (Endocrine & Metabolic):    predniSONE (DELTASONE) 20 MG tablet, Take 1 tablet (20 mg total) by mouth  daily with breakfast.   Current Outpatient Medications (Respiratory):    albuterol (VENTOLIN HFA) 108 (90 Base) MCG/ACT inhaler, INHALE 2 PUFFS BY MOUTH EVERY 6 HOURS ASNEEDED WHEEZING   budesonide-formoterol (SYMBICORT) 80-4.5 MCG/ACT inhaler, Inhale into the lungs.  Current Outpatient Medications (Analgesics):    HYDROcodone-acetaminophen (NORCO) 10-325 MG tablet, Take 1 tablet by mouth every 6 (six) hours as needed.   Current Outpatient Medications (Other):    cyclobenzaprine (FLEXERIL) 5 MG tablet, Take 1 tablet (5 mg total) by mouth 2 (two) times daily as needed for muscle spasms.   ondansetron (ZOFRAN) 4 MG  tablet, Take 1 tablet (4 mg total) by mouth every 8 (eight) hours as needed for nausea or vomiting.   tiZANidine (ZANAFLEX) 2 MG tablet, Take 1 tablet (2 mg total) by mouth at bedtime.   Vitamin D, Ergocalciferol, (DRISDOL) 1.25 MG (50000 UNIT) CAPS capsule, Take 1 capsule (50,000 Units total) by mouth every 7 (seven) days.   Reviewed prior external information including notes and imaging from  primary care provider As well as notes that were available from care everywhere and other healthcare systems.  Past medical history, social, surgical and family history all reviewed in electronic medical record.  No pertanent information unless stated regarding to the chief complaint.   Review of Systems:  No headache, visual changes, nausea, vomiting, diarrhea, constipation, dizziness, abdominal pain, skin rash, fevers, chills, night sweats, weight loss, swollen lymph nodes, body aches, joint swelling, chest pain, shortness of breath, mood changes. POSITIVE muscle aches  Objective  Blood pressure 106/66, pulse 86, height 5\' 1"  (1.549 m), weight 174 lb (78.9 kg), last menstrual period 09/14/2022, SpO2 99 %.   General: No apparent distress alert and oriented x3 mood and affect normal, dressed appropriately.  HEENT: Pupils equal, extraocular movements intact  Respiratory: Patient's speak in full sentences and does not appear short of breath  Cardiovascular: No lower extremity edema, non tender, no erythema  Ankle exam shows the left ankle still has severe tenderness to palpation noted on the anterior ATFL as well as on the deltoid ligament.  Patient does ambulate with a mild gait abnormality.  Back exam shows tightness of the low back pain.  Patient does have some limited sidebending bilaterally.  Lacks last 5 degrees of extension.  Tightness of the hip flexors.  Does have some tightness in the neck as well   Osteopathic findings  C2 flexed rotated and side bent right C4 flexed rotated and side bent  left C6 flexed rotated and side bent left T3 extended rotated and side bent right inhaled third rib L2 flexed rotated and side bent right L5 flexed rotated and side bent left Sacrum right on right     Impression and Recommendations:    The above documentation has been reviewed and is accurate and complete 09/16/2022, DO

## 2022-10-17 ENCOUNTER — Ambulatory Visit: Payer: Self-pay

## 2022-10-17 ENCOUNTER — Ambulatory Visit (INDEPENDENT_AMBULATORY_CARE_PROVIDER_SITE_OTHER): Payer: BC Managed Care – PPO | Admitting: Family Medicine

## 2022-10-17 VITALS — BP 106/66 | HR 86 | Ht 61.0 in | Wt 174.0 lb

## 2022-10-17 DIAGNOSIS — M5441 Lumbago with sciatica, right side: Secondary | ICD-10-CM

## 2022-10-17 DIAGNOSIS — M9904 Segmental and somatic dysfunction of sacral region: Secondary | ICD-10-CM

## 2022-10-17 DIAGNOSIS — M9901 Segmental and somatic dysfunction of cervical region: Secondary | ICD-10-CM | POA: Diagnosis not present

## 2022-10-17 DIAGNOSIS — M5442 Lumbago with sciatica, left side: Secondary | ICD-10-CM

## 2022-10-17 DIAGNOSIS — M25572 Pain in left ankle and joints of left foot: Secondary | ICD-10-CM

## 2022-10-17 DIAGNOSIS — M9902 Segmental and somatic dysfunction of thoracic region: Secondary | ICD-10-CM

## 2022-10-17 DIAGNOSIS — M9908 Segmental and somatic dysfunction of rib cage: Secondary | ICD-10-CM

## 2022-10-17 DIAGNOSIS — M25571 Pain in right ankle and joints of right foot: Secondary | ICD-10-CM | POA: Diagnosis not present

## 2022-10-17 DIAGNOSIS — M9903 Segmental and somatic dysfunction of lumbar region: Secondary | ICD-10-CM

## 2022-10-17 NOTE — Patient Instructions (Signed)
Good to see you You are in good hands with Rebekah [name] I can see you 6 weeks after surgery

## 2022-10-17 NOTE — Assessment & Plan Note (Signed)
Multifactorial with worsening exacerbation secondary to patient ambulating difficulty sleeping at this time.  Zanaflex 2 mg to take at bedtime.  Patient does get pain medications from another provider and can use it as needed.  Discussed posture and ergonomics otherwise.  Follow-up with me again in 6 weeks after surgery if patient does have ankle with the foot.

## 2022-11-09 ENCOUNTER — Other Ambulatory Visit: Payer: Self-pay | Admitting: Family Medicine

## 2022-11-10 MED ORDER — ONDANSETRON HCL 4 MG PO TABS: 4.0000 mg | ORAL_TABLET | Freq: Three times a day (TID) | ORAL | 0 refills | Status: DC | PRN

## 2022-12-13 ENCOUNTER — Encounter: Payer: Self-pay | Admitting: Family Medicine

## 2022-12-20 NOTE — Progress Notes (Unsigned)
Rebekah [name] Phone: [phone] Subjective:   IVilma Meckel, am serving as a scribe for Dr. Hulan Saas.  I'm seeing this patient by the request  of:  [name], Lourdes Sledge, MD  CC: Right thumb pain  [id number]  Neya Nyholm Shanekwa Molt is a 32 y.o. female coming in with complaint of back and neck pain. OMT 10/17/2022. Also f/u for B ankle pain. Patient states having right thumb pain. Started Jan 16th.  Patient's daughter pulled her thumb back accidentally with severe pain.  Medications patient has been prescribed:   Taking:         Reviewed prior external information including notes and imaging from previsou exam, outside providers and external EMR if available.   As well as notes that were available from care everywhere and other healthcare systems.  Past medical history, social, surgical and family history all reviewed in electronic medical record.  No pertanent information unless stated regarding to the chief complaint.   No past medical history on file.  Allergies  Allergen Reactions   Sumatriptan Succinate Other (See Comments)    Muscle spasms     Review of Systems:  No headache, visual changes, nausea, vomiting, diarrhea, constipation, dizziness, abdominal pain, skin rash, fevers, chills, night sweats, weight loss, swollen lymph nodes, body aches, joint swelling, chest pain, shortness of breath, mood changes. POSITIVE muscle aches  Objective  Pulse (!) 102, height 5' 1"$  (1.549 m), SpO2 98 %.   General: No apparent distress alert and oriented x3 mood and affect normal, dressed appropriately.  HEENT: Pupils equal, extraocular movements intact  Respiratory: Patient's speak in full sentences and does not appear short of breath  Cardiovascular: No lower extremity edema, non tender, no erythema  Right thumb does have significant swelling noted to the IP joint.  Laxity noted of the UCL.  No  endpoint noted with severe pain.  Neurovascular intact distally.  No pain over the [facility] joint  Patient is in a cast on the left ankle and does have significant antalgic gait walking with the aid of a walker.  Patient is tender to palpation in the lower back as well noted.  Neck exam does have increasing integument Chin as well as around patient's shoulder blades.  Limited muscular skeletal ultrasound was performed and interpreted by Hulan Saas, M  Limited ultrasound shows the patient does have potentially retraction noted of the UCL ligament.  Dynamic testing does show some gapping noted of the IP joint of the right thumb. Impression: Significant potential injury to the UCL.  Osteopathic findings  C3 flexed rotated and side bent right C6 flexed rotated and side bent left T3 extended rotated and side bent right inhaled rib T7 extended rotated and side bent right L2 flexed rotated and side bent right Sacrum right on right       Assessment and Plan:  Injury of UCL of right wrist Significantly concern that patient does have a rupture of the UCL of the right thumb.  At this point I do feel advanced imaging is warranted secondary to instability and patient unable to use it.  X-rays are ordered and looking for a Steiner lesion at the moment.  Thumb spica removable splint but encouraged her to use it day and night follow-up with me again after imaging but likely may need surgical intervention.  Patient is still healing from recent foot surgery.    Nonallopathic problems  Decision today to treat with  OMT was based on Physical Exam  After verbal consent patient was treated with HVLA, ME, FPR techniques in cervical, rib, thoracic, lumbar, and sacral  areas  Patient tolerated the procedure well with improvement in symptoms  Patient given exercises, stretches and lifestyle modifications  See medications in patient instructions if given  Patient will follow up in 4-8 weeks    The above  documentation has been reviewed and is accurate and complete Lyndal Pulley, DO          Note: This dictation was prepared with Dragon dictation along with smaller phrase technology. Any transcriptional errors that result from this process are unintentional.

## 2022-12-21 ENCOUNTER — Ambulatory Visit (INDEPENDENT_AMBULATORY_CARE_PROVIDER_SITE_OTHER): Payer: BC Managed Care – PPO | Admitting: Family Medicine

## 2022-12-21 ENCOUNTER — Ambulatory Visit (INDEPENDENT_AMBULATORY_CARE_PROVIDER_SITE_OTHER): Payer: BC Managed Care – PPO

## 2022-12-21 ENCOUNTER — Ambulatory Visit: Payer: Self-pay

## 2022-12-21 VITALS — HR 102 | Ht 61.0 in

## 2022-12-21 DIAGNOSIS — M79644 Pain in right finger(s): Secondary | ICD-10-CM | POA: Diagnosis not present

## 2022-12-21 DIAGNOSIS — M9903 Segmental and somatic dysfunction of lumbar region: Secondary | ICD-10-CM | POA: Diagnosis not present

## 2022-12-21 DIAGNOSIS — M9901 Segmental and somatic dysfunction of cervical region: Secondary | ICD-10-CM

## 2022-12-21 DIAGNOSIS — M9908 Segmental and somatic dysfunction of rib cage: Secondary | ICD-10-CM | POA: Diagnosis not present

## 2022-12-21 DIAGNOSIS — M5441 Lumbago with sciatica, right side: Secondary | ICD-10-CM

## 2022-12-21 DIAGNOSIS — S66801A Unspecified injury of other specified muscles, fascia and tendons at wrist and hand level, right hand, initial encounter: Secondary | ICD-10-CM

## 2022-12-21 DIAGNOSIS — M9902 Segmental and somatic dysfunction of thoracic region: Secondary | ICD-10-CM

## 2022-12-21 DIAGNOSIS — M9904 Segmental and somatic dysfunction of sacral region: Secondary | ICD-10-CM | POA: Diagnosis not present

## 2022-12-21 DIAGNOSIS — M5442 Lumbago with sciatica, left side: Secondary | ICD-10-CM | POA: Diagnosis not present

## 2022-12-21 NOTE — Assessment & Plan Note (Signed)
Exacerbation of low back pain secondary to how patient has been walking.  Discussed ibuprofen and anti-inflammatories.  We discussed which activities to do and which ones to avoid.  We discussed that this likely will need more evaluation in the next 4 to 6 weeks.  Patient has seen multiple other providers and are awaiting the advanced imaging of patient's thumbnail with most recent injury.

## 2022-12-21 NOTE — Assessment & Plan Note (Signed)
Significantly concern that patient does have a rupture of the UCL of the right thumb.  At this point I do feel advanced imaging is warranted secondary to instability and patient unable to use it.  X-rays are ordered and looking for a Steiner lesion at the moment.  Thumb spica removable splint but encouraged her to use it day and night follow-up with me again after imaging but likely may need surgical intervention.  Patient is still healing from recent foot surgery.

## 2022-12-21 NOTE — Patient Instructions (Addendum)
Xray today MRA R thumb [id number] Thumb spica splint We will be in touch

## 2022-12-22 ENCOUNTER — Encounter: Payer: Self-pay | Admitting: Family Medicine

## 2022-12-22 ENCOUNTER — Other Ambulatory Visit: Payer: Self-pay

## 2022-12-22 DIAGNOSIS — S66801A Unspecified injury of other specified muscles, fascia and tendons at wrist and hand level, right hand, initial encounter: Secondary | ICD-10-CM

## 2023-01-01 ENCOUNTER — Other Ambulatory Visit: Payer: Self-pay | Admitting: Orthopedic Surgery

## 2023-01-01 DIAGNOSIS — M79641 Pain in right hand: Secondary | ICD-10-CM

## 2023-01-01 DIAGNOSIS — M79644 Pain in right finger(s): Secondary | ICD-10-CM

## 2023-01-01 DIAGNOSIS — S63641A Sprain of metacarpophalangeal joint of right thumb, initial encounter: Secondary | ICD-10-CM

## 2023-01-11 ENCOUNTER — Inpatient Hospital Stay: Admission: RE | Admit: 2023-01-11 | Payer: BC Managed Care – PPO | Source: Ambulatory Visit

## 2023-01-17 ENCOUNTER — Other Ambulatory Visit: Payer: Self-pay | Admitting: Family Medicine

## 2023-01-18 MED ORDER — ONDANSETRON HCL 4 MG PO TABS: 4.0000 mg | ORAL_TABLET | Freq: Three times a day (TID) | ORAL | 0 refills | Status: DC | PRN

## 2023-01-18 MED ORDER — CYCLOBENZAPRINE HCL 5 MG PO TABS: 5.0000 mg | ORAL_TABLET | Freq: Two times a day (BID) | ORAL | 0 refills | Status: DC | PRN

## 2023-03-01 NOTE — Progress Notes (Signed)
  [name] [address] [place] [address] Phone: [phone] Subjective:   Rebekah [name], am serving as a scribe for Dr. Antoine Primas.  I'm seeing this patient by the request  of:  [name], [name], MD  CC: Low back pain follow-up with  [id number]  Rebekah [name] Rebekah [name] is a 32 y.o. female coming in with complaint of back and neck pain. OMT on 12/21/2022 Patient states back has some pain. Would like to see if another epidural is possible. No other concerns.  Medications patient has been prescribed: zofran  Taking:         Reviewed prior external information including notes and imaging from previsou exam, outside providers and external EMR if available.   As well as notes that were available from care everywhere and other healthcare systems.  Past medical history, social, surgical and family history all reviewed in electronic medical record.  No pertanent information unless stated regarding to the chief complaint.   No past medical history on file.  Allergies  Allergen Reactions   Sumatriptan Succinate Other (See Comments)    Muscle spasms     Review of Systems:  No headache, visual changes, nausea, vomiting, diarrhea, constipation, dizziness, abdominal pain, skin rash, fevers, chills, night sweats, weight loss, swollen lymph nodes, body aches, joint swelling, chest pain, shortness of breath, mood changes. POSITIVE muscle aches  Objective  Blood pressure 110/76, pulse 74, height 5\' 1"  (1.549 m), weight 151 lb (68.5 kg), SpO2 98 %.   General: No apparent distress alert and oriented x3 mood and affect normal, dressed appropriately.  HEENT: Pupils equal, extraocular movements intact  Respiratory: Patient's speak in full sentences and does not appear short of breath  Cardiovascular: No lower extremity edema, non tender, no erythema  Low back exam does have some loss lordosis noted. Tenderness to palpation in the  paraspinal musculature.  Tightness with straight leg test bilaterally.  Osteopathic findings  T3 extended rotated and side bent right inhaled rib T9 extended rotated and side bent left L1 flexed rotated and side bent right Sacrum right on right       Assessment and Plan:  Low back pain Low back exam does have some loss of lordosis.  Patient feels like she is having worsening pain again and respond extremely well to an epidural 9 months ago. Will order another one and see how patient does.  Discussed icing regimen and home exercises.  Encourage patient to continue to work on weight loss and core strength.  Follow-up with me again in 6 weeks after the injection.    Nonallopathic problems  Decision today to treat with OMT was based on Physical Exam  After verbal consent patient was treated with HVLA, ME, FPR techniques in rib, thoracic, lumbar, and sacral  areas  Patient tolerated the procedure well with improvement in symptoms  Patient given exercises, stretches and lifestyle modifications  See medications in patient instructions if given  Patient will follow up in 4-8 weeks    The above documentation has been reviewed and is accurate and complete Judi Saa, DO          Note: This dictation was prepared with Dragon dictation along with smaller phrase technology. Any transcriptional errors that result from this process are unintentional.

## 2023-03-02 ENCOUNTER — Encounter: Payer: Self-pay | Admitting: Family Medicine

## 2023-03-02 ENCOUNTER — Ambulatory Visit: Payer: Managed Care, Other (non HMO) | Admitting: Family Medicine

## 2023-03-02 VITALS — BP 110/76 | HR 74 | Ht 61.0 in | Wt 151.0 lb

## 2023-03-02 DIAGNOSIS — M5442 Lumbago with sciatica, left side: Secondary | ICD-10-CM

## 2023-03-02 DIAGNOSIS — M9902 Segmental and somatic dysfunction of thoracic region: Secondary | ICD-10-CM

## 2023-03-02 DIAGNOSIS — M9903 Segmental and somatic dysfunction of lumbar region: Secondary | ICD-10-CM

## 2023-03-02 DIAGNOSIS — M9908 Segmental and somatic dysfunction of rib cage: Secondary | ICD-10-CM

## 2023-03-02 DIAGNOSIS — M9904 Segmental and somatic dysfunction of sacral region: Secondary | ICD-10-CM

## 2023-03-02 DIAGNOSIS — M5416 Radiculopathy, lumbar region: Secondary | ICD-10-CM

## 2023-03-02 DIAGNOSIS — M5441 Lumbago with sciatica, right side: Secondary | ICD-10-CM

## 2023-03-02 NOTE — Assessment & Plan Note (Signed)
Low back exam does have some loss of lordosis.  Patient feels like she is having worsening pain again and respond extremely well to an epidural 9 months ago. Will order another one and see how patient does.  Discussed icing regimen and home exercises.  Encourage patient to continue to work on weight loss and core strength.  Follow-up with me again in 6 weeks after the injection.

## 2023-03-13 ENCOUNTER — Encounter: Payer: Self-pay | Admitting: Diagnostic Radiology

## 2023-03-13 ENCOUNTER — Encounter: Payer: Self-pay | Admitting: Family Medicine

## 2023-03-14 ENCOUNTER — Inpatient Hospital Stay: Admission: RE | Admit: 2023-03-14 | Payer: Managed Care, Other (non HMO) | Source: Ambulatory Visit

## 2023-03-14 ENCOUNTER — Ambulatory Visit
Admission: RE | Admit: 2023-03-14 | Discharge: 2023-03-14 | Discharge status: Home / Self Care | Payer: Managed Care, Other (non HMO) | Source: Ambulatory Visit | Attending: Family Medicine | Admitting: Family Medicine

## 2023-03-14 DIAGNOSIS — M5416 Radiculopathy, lumbar region: Secondary | ICD-10-CM

## 2023-03-14 MED ORDER — IOPAMIDOL (ISOVUE-M 200) INJECTION 41%
1.0000 mL | Freq: Once | INTRAMUSCULAR | Status: AC
  Administered 2023-03-14: 1 mL via EPIDURAL

## 2023-03-14 MED ORDER — METHYLPREDNISOLONE ACETATE 40 MG/ML INJ SUSP (RADIOLOG
80.0000 mg | Freq: Once | INTRAMUSCULAR | Status: AC
  Administered 2023-03-14: 80 mg via EPIDURAL

## 2023-03-14 NOTE — Discharge Instructions (Addendum)

## 2023-04-03 ENCOUNTER — Encounter: Payer: Self-pay | Admitting: Family Medicine

## 2023-04-03 ENCOUNTER — Other Ambulatory Visit: Payer: Self-pay

## 2023-04-03 DIAGNOSIS — M5416 Radiculopathy, lumbar region: Secondary | ICD-10-CM

## 2023-04-12 ENCOUNTER — Ambulatory Visit
Admission: RE | Admit: 2023-04-12 | Discharge: 2023-04-12 | Disposition: A | Discharge status: Home / Self Care | Payer: Managed Care, Other (non HMO) | Source: Ambulatory Visit | Attending: Family Medicine | Admitting: Family Medicine

## 2023-04-12 DIAGNOSIS — M5416 Radiculopathy, lumbar region: Secondary | ICD-10-CM

## 2023-04-12 MED ORDER — METHYLPREDNISOLONE ACETATE 40 MG/ML INJ SUSP (RADIOLOG
80.0000 mg | Freq: Once | INTRAMUSCULAR | Status: AC
  Administered 2023-04-12: 80 mg via EPIDURAL

## 2023-04-12 MED ORDER — IOPAMIDOL (ISOVUE-M 200) INJECTION 41%
1.0000 mL | Freq: Once | INTRAMUSCULAR | Status: AC
  Administered 2023-04-12: 1 mL via EPIDURAL

## 2023-04-12 NOTE — Discharge Instructions (Signed)

## 2023-04-23 ENCOUNTER — Other Ambulatory Visit: Payer: Self-pay | Admitting: Orthopaedic Surgery

## 2023-04-23 DIAGNOSIS — M79672 Pain in left foot: Secondary | ICD-10-CM

## 2023-05-04 ENCOUNTER — Ambulatory Visit
Admission: RE | Admit: 2023-05-04 | Discharge: 2023-05-04 | Disposition: A | Discharge status: Home / Self Care | Payer: Managed Care, Other (non HMO) | Source: Ambulatory Visit | Attending: Orthopaedic Surgery

## 2023-05-04 DIAGNOSIS — M79672 Pain in left foot: Secondary | ICD-10-CM

## 2023-06-13 NOTE — Progress Notes (Signed)
[name] [address] [place] [address] Phone: [phone] Subjective:   Rebekah [name], am serving as a scribe for Dr. Antoine Primas.  I'm seeing this patient by the request  of:  [name], [name], MD  CC: Back pain follow-up  [id number]  Rebekah [name] Rebekah [name] is a 32 y.o. female coming in with complaint of back and neck pain. OMT 03/02/2023.  Patient since we have seen her did have a wrist injury and has been working with formal physical therapy and following up with orthopedics.  Patient back exam has had difficulty.  Last lumbar epidural at L3-L4 was done on June 13.  Patient states that she had a second surgery to remove ganglion cysts in early June. Wrist pain has not improved and she notices a catching and swelling in the wrist. Pain will radiate into the fingers.   Reviewing patient's chart did have an MRI of the left foot that showed some bursitis between the third and fourth metatarsal heads as well as signs of sesamoiditis of the large toe. Patient said that the her pain has increased in ball of L foot. Pressure and flexion toes increases her pain.   Medications patient has been prescribed: None  Taking:         Reviewed prior external information including notes and imaging from previsou exam, outside providers and external EMR if available.   As well as notes that were available from care everywhere and other healthcare systems.  Past medical history, social, surgical and family history all reviewed in electronic medical record.  No pertanent information unless stated regarding to the chief complaint.   No past medical history on file.  Allergies  Allergen Reactions   Sumatriptan Succinate Other (See Comments)    Muscle spasms     Review of Systems:  No headache, visual changes, nausea, vomiting, diarrhea, constipation, dizziness, abdominal pain, skin rash, fevers, chills, night sweats, weight loss, swollen  lymph nodes, body aches, joint swelling, chest pain, shortness of breath, mood changes. POSITIVE muscle aches  Objective  Blood pressure (!) 138/92, pulse (!) 111, height 5\' 1"  (1.549 m), weight 146 lb (66.2 kg), SpO2 99%.   General: No apparent distress alert and oriented x3 mood and affect normal, dressed appropriately.  HEENT: Pupils equal, extraocular movements intact  Respiratory: Patient's speak in full sentences and does not appear short of breath  Cardiovascular: No lower extremity edema, non tender, no erythema  Low back exam does have some loss of lordosis noted.  Some tenderness to palpation noted.  Tightness noted right greater than left.  Negative straight leg test noted today. Right wrist does have postsurgical changes noted.  Does have swelling over the first dorsal compartment of the wrist noted.  Positive Finkelstein's.  Does have voluntary guarding noted.  Limited muscular skeletal ultrasound was performed and interpreted by Antoine Primas, M  Limited ultrasound shows the patient does have hypoechoic changes in the abductor pollicis longus sheath.  Questionable scar tissue formation noted with versus chronic inflammatory changes. Osteopathic findings  C2 flexed rotated and side bent right C7 flexed rotated and side bent left T3 extended rotated and side bent right inhaled rib T9 extended rotated and side bent left L2 flexed rotated and side bent right Sacrum right on right  Foot exam does show breakdown of the transverse arch noted.  Patient does have bunion and bunionette formation noted.  Severely tender over the left sesamoid bone on the plantar aspect  of the foot     Assessment and Plan:  Sesamoiditis of left foot Difficulty with the left foot.  Wants to avoid surgical intervention.  Will refer patient to sports medicine fellowship for custom orthotics.  Does have bent down of the transverse arch that is likely contributing to the sesamoiditis as well as the  bursitis noted on patient's MRI.  Patient is failed all other conservative therapy at this time.  Discussed icing regimen and home exercises.  Discussed avoiding being barefoot.  Discussed recovery sandals in the house.  Will follow-up again with me in 2 months  Low back pain Chronic problem.  Responds relatively well though to osteopathic manipulation.  Discussed core strengthening.  Discussed home exercises.  Weight seems to be stable.  Has tried other different medications over the course of time and wants to avoid taking too many if possible.  Follow-up again in 6 to 8 weeks otherwise.  Injury of UCL of right wrist Patient did have surgical intervention.  Seems to have more of a de Quervain's tenosynovitis versus some scar tissue formation.  Discussed which activities to do and which ones to avoid.  Increase activity slowly.  Will follow-up again in 6 to 8 weeks patient is to follow-up with the surgeon and see if they think an injection would be beneficial.    Nonallopathic problems  Decision today to treat with OMT was based on Physical Exam  After verbal consent patient was treated with HVLA, ME, FPR techniques in cervical, rib, thoracic, lumbar, and sacral  areas  Patient tolerated the procedure well with improvement in symptoms  Patient given exercises, stretches and lifestyle modifications  See medications in patient instructions if given  Patient will follow up in 4-8 weeks    The above documentation has been reviewed and is accurate and complete Judi Saa, DO          Note: This dictation was prepared with Dragon dictation along with smaller phrase technology. Any transcriptional errors that result from this process are unintentional.

## 2023-06-19 ENCOUNTER — Ambulatory Visit: Payer: Self-pay

## 2023-06-19 ENCOUNTER — Ambulatory Visit: Payer: Managed Care, Other (non HMO) | Admitting: Family Medicine

## 2023-06-19 ENCOUNTER — Encounter: Payer: Self-pay | Admitting: Family Medicine

## 2023-06-19 VITALS — BP 138/92 | HR 111 | Ht 61.0 in | Wt 146.0 lb

## 2023-06-19 DIAGNOSIS — M9903 Segmental and somatic dysfunction of lumbar region: Secondary | ICD-10-CM | POA: Diagnosis not present

## 2023-06-19 DIAGNOSIS — M5441 Lumbago with sciatica, right side: Secondary | ICD-10-CM

## 2023-06-19 DIAGNOSIS — M9904 Segmental and somatic dysfunction of sacral region: Secondary | ICD-10-CM | POA: Diagnosis not present

## 2023-06-19 DIAGNOSIS — M25872 Other specified joint disorders, left ankle and foot: Secondary | ICD-10-CM

## 2023-06-19 DIAGNOSIS — M9901 Segmental and somatic dysfunction of cervical region: Secondary | ICD-10-CM

## 2023-06-19 DIAGNOSIS — M9902 Segmental and somatic dysfunction of thoracic region: Secondary | ICD-10-CM | POA: Diagnosis not present

## 2023-06-19 DIAGNOSIS — S66801A Unspecified injury of other specified muscles, fascia and tendons at wrist and hand level, right hand, initial encounter: Secondary | ICD-10-CM | POA: Diagnosis not present

## 2023-06-19 DIAGNOSIS — M5442 Lumbago with sciatica, left side: Secondary | ICD-10-CM | POA: Diagnosis not present

## 2023-06-19 DIAGNOSIS — M79672 Pain in left foot: Secondary | ICD-10-CM | POA: Diagnosis not present

## 2023-06-19 DIAGNOSIS — M9908 Segmental and somatic dysfunction of rib cage: Secondary | ICD-10-CM

## 2023-06-19 NOTE — Assessment & Plan Note (Signed)
Chronic problem.  Responds relatively well though to osteopathic manipulation.  Discussed core strengthening.  Discussed home exercises.  Weight seems to be stable.  Has tried other different medications over the course of time and wants to avoid taking too many if possible.  Follow-up again in 6 to 8 weeks otherwise.

## 2023-06-19 NOTE — Assessment & Plan Note (Signed)
Difficulty with the left foot.  Wants to avoid surgical intervention.  Will refer patient to sports medicine fellowship for custom orthotics.  Does have bent down of the transverse arch that is likely contributing to the sesamoiditis as well as the bursitis noted on patient's MRI.  Patient is failed all other conservative therapy at this time.  Discussed icing regimen and home exercises.  Discussed avoiding being barefoot.  Discussed recovery sandals in the house.  Will follow-up again with me in 2 months

## 2023-06-19 NOTE — Assessment & Plan Note (Signed)
Patient did have surgical intervention.  Seems to have more of a de Quervain's tenosynovitis versus some scar tissue formation.  Discussed which activities to do and which ones to avoid.  Increase activity slowly.  Will follow-up again in 6 to 8 weeks patient is to follow-up with the surgeon and see if they think an injection would be beneficial.

## 2023-06-19 NOTE — Patient Instructions (Signed)
Orthotics See what Rebekah [name] says about the wrist Massage scar tissue  See me in 2-3 months

## 2023-08-20 NOTE — Progress Notes (Unsigned)
  [name] [address] [place] [address] Phone: [phone] Subjective:   Rebekah [name], am serving as a scribe for Dr. Antoine Primas.  I'm seeing this patient by the request  of:  [name], [name], MD  CC: Back and neck pain  [id number]  Lechelle Dalal Lilliam Ozog is a 32 y.o. female coming in with complaint of back and neck pain. Also f/u for L foot and R wrist pain. Patient states that lower R side of her back is hurting more than usual. Pain radiates into the glute.   Wrist pain has improved.   Medications patient has been prescribed: Zofran, Vit D  Taking:         Reviewed prior external information including notes and imaging from previsou exam, outside providers and external EMR if available.   As well as notes that were available from care everywhere and other healthcare systems.  Past medical history, social, surgical and family history all reviewed in electronic medical record.  No pertanent information unless stated regarding to the chief complaint.   No past medical history on file.  Allergies  Allergen Reactions   Sumatriptan Succinate Other (See Comments)    Muscle spasms     Review of Systems:  No headache, visual changes, nausea, vomiting, diarrhea, constipation, dizziness, abdominal pain, skin rash, fevers, chills, night sweats, weight loss, swollen lymph nodes, body aches, joint swelling, chest pain, shortness of breath, mood changes. POSITIVE muscle aches  Objective  Blood pressure 128/86, pulse 71, height 5\' 1"  (1.549 m), weight 142 lb (64.4 kg), SpO2 99%.   General: No apparent distress alert and oriented x3 mood and affect normal, dressed appropriately.  HEENT: Pupils equal, extraocular movements intact  Respiratory: Patient's speak in full sentences and does not appear short of breath  Cardiovascular: No lower extremity edema, non tender, no erythema  Pain is out of proportion to the amount of  palpation in the lower back as well as the upper lip.  Tightness noted in the paraspinal musculature noted.  Osteopathic findings  C2 flexed rotated and side bent right C6 flexed rotated and side bent left T3 extended rotated and side bent right inhaled rib T9 extended rotated and side bent left L1 flexed rotated and side bent right L3 flexed rotated and side bent right Sacrum right on right       Assessment and Plan:  Low back pain Chronic problem with a mild exacerbation.  Still pain is out of proportion to the amount of palpation.  Will need to continue to monitor closely.  Discussed which activities to do and which ones to avoid.  Increase activity slowly otherwise.  Follow-up with me again in 6 to 8 weeks    Nonallopathic problems  Decision today to treat with OMT was based on Physical Exam  After verbal consent patient was treated with HVLA, ME, FPR techniques in cervical, rib, thoracic, lumbar, and sacral  areas  Patient tolerated the procedure well with improvement in symptoms  Patient given exercises, stretches and lifestyle modifications  See medications in patient instructions if given  Patient will follow up in 4-8 weeks     The above documentation has been reviewed and is accurate and complete Judi Saa, DO         Note: This dictation was prepared with Dragon dictation along with smaller phrase technology. Any transcriptional errors that result from this process are unintentional.

## 2023-08-21 ENCOUNTER — Encounter: Payer: Self-pay | Admitting: Family Medicine

## 2023-08-21 ENCOUNTER — Ambulatory Visit (INDEPENDENT_AMBULATORY_CARE_PROVIDER_SITE_OTHER): Payer: Managed Care, Other (non HMO) | Admitting: Family Medicine

## 2023-08-21 VITALS — BP 128/86 | HR 71 | Ht 61.0 in | Wt 142.0 lb

## 2023-08-21 DIAGNOSIS — M9904 Segmental and somatic dysfunction of sacral region: Secondary | ICD-10-CM | POA: Diagnosis not present

## 2023-08-21 DIAGNOSIS — M9901 Segmental and somatic dysfunction of cervical region: Secondary | ICD-10-CM

## 2023-08-21 DIAGNOSIS — M5442 Lumbago with sciatica, left side: Secondary | ICD-10-CM | POA: Diagnosis not present

## 2023-08-21 DIAGNOSIS — M9903 Segmental and somatic dysfunction of lumbar region: Secondary | ICD-10-CM | POA: Diagnosis not present

## 2023-08-21 DIAGNOSIS — M5441 Lumbago with sciatica, right side: Secondary | ICD-10-CM

## 2023-08-21 DIAGNOSIS — M9902 Segmental and somatic dysfunction of thoracic region: Secondary | ICD-10-CM | POA: Diagnosis not present

## 2023-08-21 DIAGNOSIS — M9908 Segmental and somatic dysfunction of rib cage: Secondary | ICD-10-CM

## 2023-08-21 NOTE — Assessment & Plan Note (Signed)
Chronic problem with a mild exacerbation.  Still pain is out of proportion to the amount of palpation.  Will need to continue to monitor closely.  Discussed which activities to do and which ones to avoid.  Increase activity slowly otherwise.  Follow-up with me again in 6 to 8 weeks

## 2023-08-21 NOTE — Patient Instructions (Signed)
Good to see you! Keep working on exercises See you again in 5-6 weeks

## 2023-10-04 NOTE — Progress Notes (Signed)
[name] [address] [place] [address] Phone: [phone] Subjective:   Rebekah [name], am serving as a scribe for Dr. Antoine Primas.  I'm seeing this patient by the request  of:  [name], [name], MD  CC: Multiple joint complaints mostly low back pain  [id number]  Rebekah [name] is a 32 y.o. female coming in with complaint of back and neck pain. OMT 08/21/2023. Patient states that her lower back pain seemed to increase. Pain radiating down into both hips when she stands. Trying to wait on epidural as she is only able to get one more prior to June. Not getting the same relief from injections.   Would like refills on zofran and flexeril.   Medications patient has been prescribed: None  Taking:         Reviewed prior external information including notes and imaging from previsou exam, outside providers and external EMR if available.   As well as notes that were available from care everywhere and other healthcare systems.  Past medical history, social, surgical and family history all reviewed in electronic medical record.  No pertanent information unless stated regarding to the chief complaint.   No past medical history on file.  Allergies  Allergen Reactions   Sumatriptan Succinate Other (See Comments)    Muscle spasms     Review of Systems:  No headache, visual changes, nausea, vomiting, diarrhea, constipation, dizziness, abdominal pain, skin rash, fevers, chills, night sweats, weight loss, swollen lymph nodes, body aches, joint swelling, chest pain, shortness of breath, mood changes. POSITIVE muscle aches  Objective  Blood pressure 122/82, pulse 83, height 5\' 1"  (1.549 m), weight 142 lb (64.4 kg), SpO2 97%.   General: No apparent distress alert and oriented x3 mood and affect normal, dressed appropriately.  HEENT: Pupils equal, extraocular movements intact  Respiratory: Patient's speak in full sentences and  does not appear short of breath  Cardiovascular: No lower extremity edema, non tender, no erythema  Gait MSK:  Back low back does have some loss lordosis noted.  Some tenderness to palpation in the paraspinal musculature.  Tightness with [name] right greater than left.  Mostly seems to be tenderness around the sacroiliac joint.  Osteopathic findings  C6 flexed rotated and side bent right T3 extended rotated and side bent right inhaled rib T6 extended rotated and side bent left L2 flexed rotated and side bent right L4 flexed rotated and side bent right Sacrum right on right     Assessment and Plan:  Low back pain Low back exam does have some loss lordosis noted.  Some tenderness to palpation in the paraspinal musculature.  Tightness with [name] right greater than left.  Will continue to work on [employer].  Discussed different medications again.  Cyclobenzaprine 5 mg to do think would be beneficial and refill today.  Follow-up again in 6 to 8 weeks    Nonallopathic problems  Decision today to treat with OMT was based on Physical Exam  After verbal consent patient was treated with HVLA, ME, FPR techniques in cervical, rib, thoracic, lumbar, and sacral  areas  Patient tolerated the procedure well with improvement in symptoms  Patient given exercises, stretches and lifestyle modifications  See medications in patient instructions if given  Patient will follow up in 4-8 weeks    The above documentation has been reviewed and is accurate and complete Judi Saa, DO  Note: This dictation was prepared with Dragon dictation along with smaller phrase technology. Any transcriptional errors that result from this process are unintentional.

## 2023-10-09 ENCOUNTER — Other Ambulatory Visit: Payer: Self-pay

## 2023-10-09 ENCOUNTER — Ambulatory Visit (INDEPENDENT_AMBULATORY_CARE_PROVIDER_SITE_OTHER): Payer: Managed Care, Other (non HMO) | Admitting: Family Medicine

## 2023-10-09 ENCOUNTER — Encounter: Payer: Self-pay | Admitting: Family Medicine

## 2023-10-09 VITALS — BP 122/82 | HR 83 | Ht 61.0 in | Wt 142.0 lb

## 2023-10-09 DIAGNOSIS — M5442 Lumbago with sciatica, left side: Secondary | ICD-10-CM

## 2023-10-09 DIAGNOSIS — M9903 Segmental and somatic dysfunction of lumbar region: Secondary | ICD-10-CM | POA: Diagnosis not present

## 2023-10-09 DIAGNOSIS — M9908 Segmental and somatic dysfunction of rib cage: Secondary | ICD-10-CM

## 2023-10-09 DIAGNOSIS — M9904 Segmental and somatic dysfunction of sacral region: Secondary | ICD-10-CM

## 2023-10-09 DIAGNOSIS — M9902 Segmental and somatic dysfunction of thoracic region: Secondary | ICD-10-CM | POA: Diagnosis not present

## 2023-10-09 DIAGNOSIS — M9901 Segmental and somatic dysfunction of cervical region: Secondary | ICD-10-CM | POA: Diagnosis not present

## 2023-10-09 DIAGNOSIS — M5441 Lumbago with sciatica, right side: Secondary | ICD-10-CM

## 2023-10-09 MED ORDER — CYCLOBENZAPRINE HCL 5 MG PO TABS: 5.0000 mg | ORAL_TABLET | Freq: Two times a day (BID) | ORAL | 0 refills | Status: DC | PRN

## 2023-10-09 MED ORDER — ONDANSETRON HCL 4 MG PO TABS: 4.0000 mg | ORAL_TABLET | Freq: Three times a day (TID) | ORAL | 0 refills | Status: AC | PRN

## 2023-10-09 NOTE — Assessment & Plan Note (Signed)
Low back exam does have some loss lordosis noted.  Some tenderness to palpation in the paraspinal musculature.  Tightness with [name] right greater than left.  Will continue to work on [employer] cabin [employer].  Discussed different medications again.  Cyclobenzaprine 5 mg to do think would be beneficial and refill today.  Follow-up again in 6 to 8 weeks

## 2023-10-09 NOTE — Patient Instructions (Addendum)
Meds refilled Good to see you See me again in 2 months

## 2023-11-22 ENCOUNTER — Encounter: Payer: Self-pay | Admitting: Family Medicine

## 2023-11-23 ENCOUNTER — Other Ambulatory Visit: Payer: Self-pay

## 2023-11-23 DIAGNOSIS — M5416 Radiculopathy, lumbar region: Secondary | ICD-10-CM

## 2023-11-26 ENCOUNTER — Other Ambulatory Visit: Payer: Self-pay

## 2023-11-26 MED ORDER — CYCLOBENZAPRINE HCL 5 MG PO TABS: 5.0000 mg | ORAL_TABLET | Freq: Two times a day (BID) | ORAL | 0 refills | Status: AC | PRN

## 2023-12-04 ENCOUNTER — Encounter: Payer: Self-pay | Admitting: Family Medicine

## 2023-12-04 NOTE — Discharge Instructions (Signed)

## 2023-12-05 ENCOUNTER — Ambulatory Visit
Admission: RE | Admit: 2023-12-05 | Discharge: 2023-12-05 | Disposition: A | Discharge status: Home / Self Care | Payer: Managed Care, Other (non HMO) | Source: Ambulatory Visit | Attending: Family Medicine | Admitting: Family Medicine

## 2023-12-05 DIAGNOSIS — M5416 Radiculopathy, lumbar region: Secondary | ICD-10-CM

## 2023-12-05 MED ORDER — METHYLPREDNISOLONE ACETATE 40 MG/ML INJ SUSP (RADIOLOG
80.0000 mg | Freq: Once | INTRAMUSCULAR | Status: AC
  Administered 2023-12-05: 80 mg via EPIDURAL

## 2023-12-05 MED ORDER — IOPAMIDOL (ISOVUE-M 200) INJECTION 41%
1.0000 mL | Freq: Once | INTRAMUSCULAR | Status: AC
  Administered 2023-12-05: 1 mL via EPIDURAL

## 2023-12-11 ENCOUNTER — Ambulatory Visit: Payer: Managed Care, Other (non HMO) | Admitting: Family Medicine

## 2024-01-02 NOTE — Progress Notes (Unsigned)
 [name] [address] [place] [address] Phone: [phone] Subjective:   [name], am serving as a scribe for Dr. Antoine Primas.  I'm seeing this patient by the request  of:  [name], [name], MD  CC: Back and neck pain follow-up  [id number]  Rebekah [name] is a 33 y.o. female coming in with complaint of back and neck pain. OMT 10/09/2023. Patient states that her back pain is better than before the epidural but is still bother her quite a bit. Sitting and driving for a long period of time. Laying on back and riding on exercise bike also increase her pain.   Medications patient has been prescribed: Flexeril, Zofran  Taking:         Reviewed prior external information including notes and imaging from previsou exam, outside providers and external EMR if available.   As well as notes that were available from care everywhere and other healthcare systems.  Past medical history, social, surgical and family history all reviewed in electronic medical record.  No pertanent information unless stated regarding to the chief complaint.   No past medical history on file.  Allergies  Allergen Reactions   Sumatriptan Succinate Other (See Comments)    Muscle spasms     Review of Systems:  No headache, visual changes, nausea, vomiting, diarrhea, constipation, dizziness, abdominal pain, skin rash, fevers, chills, night sweats, weight loss, swollen lymph nodes, body aches, joint swelling, chest pain, shortness of breath, mood changes. POSITIVE muscle aches  Objective  Blood pressure 120/84, pulse (!) 48, height 5\' 1"  (1.549 m), weight 140 lb (63.5 kg), SpO2 100%.   General: No apparent distress alert and oriented x3 mood and affect normal, dressed appropriately.  HEENT: Pupils equal, extraocular movements intact  Respiratory: Patient's speak in full sentences and does not appear short of breath  Cardiovascular: No lower  extremity edema, non tender, no erythema  Gait relatively normal MSK:  Back does have some loss lordosis noted.  Some tenderness to palpation noted.  Patient does have pain that does seem to be out of proportion to the amount of palpation.  Negative straight leg test noted.  Tightness noted in the paraspinal musculature of the lumbar spine as well as in the thoracic spine.  Osteopathic findings  C2 flexed rotated and side bent right C7 flexed rotated and side bent left T3 extended rotated and side bent right inhaled rib T9 extended rotated and side bent left L2 flexed rotated and side bent right L4 flexed rotated and side bent right. Sacrum right on right       Assessment and Plan:  Low back pain Pain in the low back and seems to be still out of proportion.  Attempted osteopathic manipulation but unfortunately continues to have discomfort and pain.  Discussed icing treatment and home exercises.  Discussed multiple different medications with patient as well.  Patient declined any other significant changes at this time.  Declined any type of referrals to discuss low-dose naltrexone with pain management as well.  Can follow-up with me every 6 weeks if necessary.    Nonallopathic problems  Decision today to treat with OMT was based on Physical Exam  After verbal consent patient was treated with HVLA, ME, FPR techniques in cervical, rib, thoracic, lumbar, and sacral  areas  Patient tolerated the procedure well with improvement in symptoms  Patient given exercises, stretches and lifestyle modifications  See medications in patient instructions if given  Patient will follow up in 4-8 weeks     The above documentation has been reviewed and is accurate and complete Judi Saa, DO         Note: This dictation was prepared with Dragon dictation along with smaller phrase technology. Any transcriptional errors that result from this process are unintentional.

## 2024-01-03 ENCOUNTER — Ambulatory Visit: Payer: Managed Care, Other (non HMO) | Admitting: Family Medicine

## 2024-01-03 ENCOUNTER — Encounter: Payer: Self-pay | Admitting: Family Medicine

## 2024-01-03 VITALS — BP 120/84 | HR 48 | Ht 61.0 in | Wt 140.0 lb

## 2024-01-03 DIAGNOSIS — M5442 Lumbago with sciatica, left side: Secondary | ICD-10-CM | POA: Diagnosis not present

## 2024-01-03 DIAGNOSIS — M9902 Segmental and somatic dysfunction of thoracic region: Secondary | ICD-10-CM | POA: Diagnosis not present

## 2024-01-03 DIAGNOSIS — M9901 Segmental and somatic dysfunction of cervical region: Secondary | ICD-10-CM | POA: Diagnosis not present

## 2024-01-03 DIAGNOSIS — M9904 Segmental and somatic dysfunction of sacral region: Secondary | ICD-10-CM | POA: Diagnosis not present

## 2024-01-03 DIAGNOSIS — M5441 Lumbago with sciatica, right side: Secondary | ICD-10-CM

## 2024-01-03 DIAGNOSIS — M9908 Segmental and somatic dysfunction of rib cage: Secondary | ICD-10-CM | POA: Diagnosis not present

## 2024-01-03 DIAGNOSIS — M9903 Segmental and somatic dysfunction of lumbar region: Secondary | ICD-10-CM

## 2024-01-03 NOTE — Assessment & Plan Note (Signed)
 Pain in the low back and seems to be still out of proportion.  Attempted osteopathic manipulation but unfortunately continues to have discomfort and pain.  Discussed icing treatment and home exercises.  Discussed multiple different medications with patient as well.  Patient declined any other significant changes at this time.  Declined any type of referrals to discuss low-dose naltrexone with pain management as well.  Can follow-up with me every 6 weeks if necessary.

## 2024-01-03 NOTE — Patient Instructions (Signed)
Good to see you! See you again in 6-7 weeks 

## 2024-01-30 ENCOUNTER — Encounter: Payer: Self-pay | Admitting: Family Medicine

## 2024-01-30 ENCOUNTER — Other Ambulatory Visit: Payer: Self-pay | Admitting: Family Medicine

## 2024-01-30 DIAGNOSIS — M5416 Radiculopathy, lumbar region: Secondary | ICD-10-CM

## 2024-01-30 DIAGNOSIS — M5441 Lumbago with sciatica, right side: Secondary | ICD-10-CM

## 2024-02-04 ENCOUNTER — Other Ambulatory Visit: Payer: Self-pay

## 2024-02-04 DIAGNOSIS — M5441 Lumbago with sciatica, right side: Secondary | ICD-10-CM

## 2024-02-20 NOTE — Progress Notes (Unsigned)
  [facility] [address] [place] [address] Phone: [phone] Subjective:   Rebekah [name] am a scribe for Dr. Felipe Horton.   I'm seeing this patient by the request  of:  [name], Delaine Favorite, MD  CC: Back and neck pain follow-up  [id number]  Rebekah [name] Rebekah [name] is a 33 y.o. female coming in with complaint of back and neck pain.  Had an epidural February 5. OMT 01/03/2024. Patient states going to physiotherapy but it isn't great.   Medications patient has been prescribed: Flexeril   Taking: prn         Reviewed prior external information including notes and imaging from previsou exam, outside providers and external EMR if available.   As well as notes that were available from care everywhere and other healthcare systems.  Past medical history, social, surgical and family history all reviewed in electronic medical record.  No pertanent information unless stated regarding to the chief complaint.   No past medical history on file.  Allergies  Allergen Reactions   Sumatriptan Succinate Other (See Comments)    Muscle spasms     Review of Systems:  No headache, visual changes, nausea, vomiting, diarrhea, constipation, dizziness, abdominal pain, skin rash, fevers, chills, night sweats, weight loss, swollen lymph nodes, body aches, joint swelling, chest pain, shortness of breath, mood changes. POSITIVE muscle aches  Objective  Blood pressure 120/70, pulse 85, height 5\' 1"  (1.549 m), weight 137 lb 12.8 oz (62.5 kg), SpO2 99%.   General: No apparent distress alert and oriented x3 mood and affect normal, dressed appropriately.  HEENT: Pupils equal, extraocular movements intact  Respiratory: Patient's speak in full sentences and does not appear short of breath  Cardiovascular: No lower extremity edema, non tender, no erythema  Gait MSK:  Back voluntary guarding noted.  Some tightness throughout.  Difficult to do some range of  motion  Osteopathic findings  C2 flexed rotated and side bent right C6 flexed rotated and side bent right T3 extended rotated and side bent right inhaled rib T9 extended rotated and side bent left L2 flexed rotated and side bent right L4 flexed rotated and side bent left Sacrum right on right       Assessment and Plan:  Low back pain Multifactorial, has responded somewhat to epidurals and osteopathic manipulation.  Doing physical therapy.  Gets some benefit from KT tape.  Follow-up again in 6 to 8 weeks    Nonallopathic problems  Decision today to treat with OMT was based on Physical Exam  After verbal consent patient was treated with HVLA, ME, FPR techniques in cervical, rib, thoracic, lumbar, and sacral  areas  Patient tolerated the procedure well with improvement in symptoms  Patient given exercises, stretches and lifestyle modifications  See medications in patient instructions if given  Patient will follow up in 8 weeks     The above documentation has been reviewed and is accurate and complete Rebekah [name] M Rebekah Spagna, DO         Note: This dictation was prepared with Dragon dictation along with smaller phrase technology. Any transcriptional errors that result from this process are unintentional.

## 2024-02-21 ENCOUNTER — Encounter: Payer: Self-pay | Admitting: Family Medicine

## 2024-02-21 ENCOUNTER — Ambulatory Visit: Admitting: Family Medicine

## 2024-02-21 VITALS — BP 120/70 | HR 85 | Ht 61.0 in | Wt 137.8 lb

## 2024-02-21 DIAGNOSIS — M9902 Segmental and somatic dysfunction of thoracic region: Secondary | ICD-10-CM | POA: Diagnosis not present

## 2024-02-21 DIAGNOSIS — M9903 Segmental and somatic dysfunction of lumbar region: Secondary | ICD-10-CM | POA: Diagnosis not present

## 2024-02-21 DIAGNOSIS — M5442 Lumbago with sciatica, left side: Secondary | ICD-10-CM | POA: Diagnosis not present

## 2024-02-21 DIAGNOSIS — M9901 Segmental and somatic dysfunction of cervical region: Secondary | ICD-10-CM

## 2024-02-21 DIAGNOSIS — M9908 Segmental and somatic dysfunction of rib cage: Secondary | ICD-10-CM

## 2024-02-21 DIAGNOSIS — M9904 Segmental and somatic dysfunction of sacral region: Secondary | ICD-10-CM | POA: Diagnosis not present

## 2024-02-21 DIAGNOSIS — M5441 Lumbago with sciatica, right side: Secondary | ICD-10-CM

## 2024-02-21 NOTE — Patient Instructions (Signed)
 Get the necklace. Keep up with the PT and the taping. Return in 2 to 3 months.

## 2024-02-21 NOTE — Assessment & Plan Note (Signed)
 Multifactorial, has responded somewhat to epidurals and osteopathic manipulation.  Doing physical therapy.  Gets some benefit from KT tape.  Follow-up again in 6 to 8 weeks

## 2024-05-05 NOTE — Progress Notes (Unsigned)
  [name] [address] [place] [address] Phone: [phone] Subjective:   Rebekah [name], am serving as a scribe for Dr. Arthea Claudene.  I'm seeing this patient by the request  of:  [name], [name], MD  CC: Back and neck pain  [name]  Rebekah [name] is a 33 y.o. female coming in with complaint of back and neck pain. OMT 02/21/2024. Patient states doing better, but still going to therapy.  Medications patient has been prescribed: Flexeril   Taking:         Reviewed prior external information including notes and imaging from previsou exam, outside providers and external EMR if available.   As well as notes that were available from care everywhere and other healthcare systems.  Past medical history, social, surgical and family history all reviewed in electronic medical record.  No pertanent information unless stated regarding to the chief complaint.   No past medical history on file.  Allergies  Allergen Reactions   Sumatriptan Succinate Other (See Comments)    Muscle spasms     Review of Systems:  No  visual changes, nausea, vomiting, diarrhea, constipation, dizziness, abdominal pain, skin rash, fevers, chills, night sweats, weight loss, swollen lymph nodes, body aches, joint swelling, chest pain, shortness of breath, mood changes. POSITIVE muscle aches, headache  Objective  Blood pressure 118/86, pulse 87, height 5' 1 (1.549 m), weight 138 lb (62.6 kg), SpO2 98%.   General: No apparent distress alert and oriented x3 mood and affect normal, dressed appropriately.  HEENT: Pupils equal, extraocular movements intact  Respiratory: Patient's speak in full sentences and does not appear short of breath  Cardiovascular: No lower extremity edema, non tender, no erythema  Gait relatively normal. MSK:  Back does have some loss lordosis.  Pain out of proportion to the amount of palpation.  Patient does move relatively  slowly but a lot of it is secondary to voluntary guarding.  Osteopathic findings  C2 flexed rotated and side bent right C6 flexed rotated and side bent right T3 extended rotated and side bent right inhaled rib T9 extended rotated and side bent left L1 flexed rotated and side bent right L3 flexed rotated and side bent left Sacrum right on right    Assessment and Plan:  Low back pain Patient has headaches and low back pain.  This seems to be multifactorial, discussed with patient about different treatment options.  Has been responding well to nonoperative therapy by needling.  No further workup necessary at this time.  Discussed regimen of home exercises.  Follow-up with me again in 2 to 3 months.    Nonallopathic problems  Decision today to treat with OMT was based on Physical Exam  After verbal consent patient was treated with HVLA, ME, FPR techniques in cervical, rib, thoracic, lumbar, and sacral  areas avoided HVLA on the neck.  Patient tolerated the procedure well with improvement in symptoms  Patient given exercises, stretches and lifestyle modifications  See medications in patient instructions if given  Patient will follow up in 4-8 weeks      The above documentation has been reviewed and is accurate and complete Rebekah Lindamood M Jenika Chiem, DO        Note: This dictation was prepared with Dragon dictation along with smaller phrase technology. Any transcriptional errors that result from this process are unintentional.

## 2024-05-06 ENCOUNTER — Ambulatory Visit: Admitting: Family Medicine

## 2024-05-06 ENCOUNTER — Encounter: Payer: Self-pay | Admitting: Family Medicine

## 2024-05-06 VITALS — BP 118/86 | HR 87 | Ht 61.0 in | Wt 138.0 lb

## 2024-05-06 DIAGNOSIS — M9908 Segmental and somatic dysfunction of rib cage: Secondary | ICD-10-CM | POA: Diagnosis not present

## 2024-05-06 DIAGNOSIS — M5441 Lumbago with sciatica, right side: Secondary | ICD-10-CM | POA: Diagnosis not present

## 2024-05-06 DIAGNOSIS — M9902 Segmental and somatic dysfunction of thoracic region: Secondary | ICD-10-CM | POA: Diagnosis not present

## 2024-05-06 DIAGNOSIS — M9904 Segmental and somatic dysfunction of sacral region: Secondary | ICD-10-CM

## 2024-05-06 DIAGNOSIS — R519 Headache, unspecified: Secondary | ICD-10-CM | POA: Diagnosis not present

## 2024-05-06 DIAGNOSIS — M9901 Segmental and somatic dysfunction of cervical region: Secondary | ICD-10-CM | POA: Diagnosis not present

## 2024-05-06 DIAGNOSIS — M9903 Segmental and somatic dysfunction of lumbar region: Secondary | ICD-10-CM | POA: Diagnosis not present

## 2024-05-06 DIAGNOSIS — M5442 Lumbago with sciatica, left side: Secondary | ICD-10-CM | POA: Diagnosis not present

## 2024-05-06 DIAGNOSIS — M25512 Pain in left shoulder: Secondary | ICD-10-CM | POA: Diagnosis not present

## 2024-05-06 NOTE — Assessment & Plan Note (Signed)
 Patient has headaches and low back pain.  This seems to be multifactorial, discussed with patient about different treatment options.  Has been responding well to nonoperative therapy by needling.  No further workup necessary at this time.  Discussed regimen of home exercises.  Follow-up with me again in 2 to 3 months.

## 2024-05-06 NOTE — Assessment & Plan Note (Signed)
 Continues to have some discomfort in the shoulders that could be secondary to more of the neck.  Will refer patient back to formal physical therapy for further evaluation of this.

## 2024-05-06 NOTE — Patient Instructions (Signed)
 PT for headaches Will get back to you about mom See you again in 2-3 months

## 2024-05-17 ENCOUNTER — Encounter: Payer: Self-pay | Admitting: Family Medicine

## 2024-07-01 ENCOUNTER — Ambulatory Visit: Admission: EM | Admit: 2024-07-01 | Discharge: 2024-07-01 | Disposition: A | Discharge status: Home / Self Care

## 2024-07-01 ENCOUNTER — Ambulatory Visit (HOSPITAL_COMMUNITY): Payer: Self-pay

## 2024-07-01 ENCOUNTER — Ambulatory Visit (INDEPENDENT_AMBULATORY_CARE_PROVIDER_SITE_OTHER)

## 2024-07-01 DIAGNOSIS — J069 Acute upper respiratory infection, unspecified: Secondary | ICD-10-CM

## 2024-07-01 DIAGNOSIS — S90852A Superficial foreign body, left foot, initial encounter: Secondary | ICD-10-CM

## 2024-07-01 DIAGNOSIS — R052 Subacute cough: Secondary | ICD-10-CM | POA: Diagnosis not present

## 2024-07-01 HISTORY — DX: Unspecified asthma, uncomplicated: J45.909

## 2024-07-01 MED ORDER — AMOXICILLIN-POT CLAVULANATE 875-125 MG PO TABS: 1.0000 | ORAL_TABLET | Freq: Two times a day (BID) | ORAL | 0 refills | Status: AC

## 2024-07-01 MED ORDER — BENZONATATE 100 MG PO CAPS: 200.0000 mg | ORAL_CAPSULE | Freq: Three times a day (TID) | ORAL | 0 refills | Status: AC

## 2024-07-01 MED ORDER — PROMETHAZINE-DM 6.25-15 MG/5ML PO SYRP: 5.0000 mL | ORAL_SOLUTION | Freq: Four times a day (QID) | ORAL | 0 refills | Status: AC | PRN

## 2024-07-01 MED ORDER — IPRATROPIUM BROMIDE 0.06 % NA SOLN: 2.0000 | Freq: Four times a day (QID) | NASAL | 12 refills | Status: AC

## 2024-07-01 NOTE — ED Provider Notes (Addendum)
 [facility]    CSN: [id number] Arrival date & time: 07/01/24  1432      History   Chief Complaint Chief Complaint  Patient presents with   Cough   Fatigue   Sore Throat   Foot Pain    HPI Rebekah [name] is a 33 y.o. female.   HPI  33 year old female with past medical history significant for Morton's neuroma of the right foot, foreign body in the left foot, mild back pain, and sacral somatic dysfunction of the spine presents for evaluation of respiratory complaints and possible foreign body in the sole of her left foot.  With regards to her respiratory complaints she is experiencing symptoms since early August.  She was seen on 819 and prescribed a Z-Pak and prednisone  without improvement of symptoms.  She is complaining of ongoing sore throat, fatigue, cough, shortness breath, and wheezing.  She has been using her inhalers without improvement of symptoms.  She also reports that she had a fever several days ago but she has no fever at present.  With regards to the foreign body in her left foot she reports that she was walking on the beach and stepped on some shell and feels she still has a piece of shell in her foot.  She also stepped on a piece of broken glass last night.  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      Home Medications    Prior to Admission medications   Medication Sig Start Date End Date Taking? Authorizing Provider  albuterol (VENTOLIN HFA) 108 (90 Base) MCG/ACT inhaler INHALE 2 PUFFS BY MOUTH EVERY 6 HOURS ASNEEDED WHEEZING 01/09/19  Yes [provider]  amoxicillin -clavulanate (AUGMENTIN ) 875-125 MG tablet Take 1 tablet by mouth every 12 (twelve) hours for 7 days. 07/01/24 07/08/24 Yes Bernardino Ditch, NP  amphetamine-dextroamphetamine (ADDERALL XR) 10 MG 24 hr capsule    Yes [provider]  benzonatate  (TESSALON ) 100 MG capsule Take 2 capsules (200 mg total) by mouth every 8 (eight) hours. 07/01/24  Yes Bernardino Ditch, NP  budesonide-formoterol [facility]) 80-4.5 MCG/ACT inhaler Inhale into the lungs. 11/05/19  Yes [provider]  ipratropium (ATROVENT ) 0.06 % nasal spray Place 2 sprays into both nostrils 4 (four) times daily. 07/01/24  Yes Bernardino Ditch, NP  promethazine -dextromethorphan (PROMETHAZINE -DM) 6.25-15 MG/5ML syrup Take 5 mLs by mouth 4 (four) times daily as needed. 07/01/24  Yes Bernardino Ditch, NP  cyclobenzaprine  (FLEXERIL ) 5 MG tablet Take 1 tablet (5 mg total) by mouth 2 (two) times daily as needed for muscle spasms. 11/26/23   Claudene Arthea HERO, DO  ondansetron  (ZOFRAN ) 4 MG tablet Take 1 tablet (4 mg total) by mouth  every 8 (eight) hours as needed for nausea or vomiting. 10/09/23   Claudene Arthea HERO, DO    Family History History reviewed. No pertinent family history.  Social History Social History   Tobacco Use   Smoking status: Never   Smokeless tobacco: Never  Vaping Use   Vaping status: Never Used  Substance Use Topics   Alcohol use: Yes   Drug use: Not Currently     Allergies   Sumatriptan succinate   Review of Systems Review of Systems  Constitutional:  Positive for fever.  HENT:  Positive for congestion, ear pain, rhinorrhea and sore throat.   Respiratory:  Positive for  cough, shortness of breath and wheezing.   Skin:  Positive for wound.     Physical Exam Triage Vital Signs ED Triage Vitals  Encounter Vitals Group     BP      Girls Systolic BP Percentile      Girls Diastolic BP Percentile      Boys Systolic BP Percentile      Boys Diastolic BP Percentile      Pulse      Resp      Temp      Temp src      SpO2      Weight      Height      Head Circumference      Peak Flow      Pain Score      Pain Loc      Pain Education      Exclude from Growth Chart    No data found.  Updated Vital Signs BP 136/84 (BP Location: Left Arm)   Pulse 65   Temp 98.8 F (37.1 C) (Oral)   Resp 18   Wt 135 lb (61.2 kg)   LMP 06/27/2024 (Exact Date)   SpO2 98%   BMI 25.51 kg/m   Visual Acuity Right Eye Distance:   Left Eye Distance:   Bilateral Distance:    Right Eye Near:   Left Eye Near:    Bilateral Near:     Physical Exam Vitals and nursing note reviewed.  Constitutional:      Appearance: Normal appearance. She is not ill-appearing.  HENT:     Head: Normocephalic and atraumatic.     Right Ear: Tympanic membrane, ear canal and external ear normal. There is no impacted cerumen.     Left Ear: Tympanic membrane, ear canal and external ear normal. There is no impacted cerumen.     Nose: Congestion and rhinorrhea present.     Comments: His mucosa is erythematous and edematous with out appreciable discharge.  Bilateral frontal and maxillary sinuses are tender to compression.    Mouth/Throat:     Mouth: Mucous membranes are moist.     Pharynx: Oropharynx is clear. Posterior oropharyngeal erythema present. No oropharyngeal exudate.     Comments: Erythema to the posterior oropharynx with clear postnasal drip. Cardiovascular:     Rate and Rhythm: Normal rate and regular rhythm.     Pulses: Normal pulses.     Heart sounds: Normal heart sounds. No murmur heard.    No friction rub. No gallop.  Pulmonary:     Effort: Pulmonary effort is normal.      Breath sounds: Normal breath sounds. No wheezing, rhonchi or rales.  Musculoskeletal:     Cervical back: Normal range of motion and neck supple. No tenderness.  Lymphadenopathy:     Cervical: No cervical adenopathy.  Skin:  General: Skin is warm and dry.     Capillary Refill: Capillary refill takes less than 2 seconds.     Findings: No erythema.  Neurological:     General: No focal deficit present.     Mental Status: She is alert and oriented to person, place, and time.      UC Treatments / Results  Labs (all labs ordered are listed, but only abnormal results are displayed) Labs Reviewed - No data to display  EKG   Radiology No results found.  Procedures Procedures (including critical care time)  Medications Ordered in UC Medications - No data to display  Initial Impression / Assessment and Plan / UC Course  I have reviewed the triage vital signs and the nursing notes.  Pertinent labs & imaging results that were available during my care of the patient were reviewed by me and considered in my medical decision making (see chart for details).   Patient is a nontoxic-appearing 33 year old female presenting for evaluation of respiratory complaints as outlined in HPI above.  Her exam does reveal inflammation of her nasal mucosa without appreciable discharge.  She has erythema to the posterior oropharynx with injection and clear postnasal drip.  No cervical lymphadenopathy present.  She does have tenderness to compression of bilateral frontal and maxillary sinuses.  Cardiopulmonary exam reveals decreased lung sounds in the bases but otherwise unremarkable.  The patient reports that she is intermittently experiencing a productive cough for yellow sputum and intermittent fevers.  Therefore, I will obtain a chest x-ray to evaluate for any acute cardiopulmonary pathology.  With regards to the possible foreign body in the sole of the patient's left foot these include a possible piece  of a seashell as well as a possible piece of glass.  As you can see in image above, she has several small puncture wounds with no active bleeding.  I have advised the patient that I do not dig for foreign bodies in the soles of people's feet.  I will refer her to podiatry for further evaluation and management.  Chest x-ray independent reviewed and evaluated by me.  Impression: Lung fields are well aerated without evidence of infiltrate or effusion.  Cardiomediastinal silhouette appears normal.  Radiology overread is pending. Radiology impression states no active cardiopulmonary disease.  I will discharge patient with diagnosis of URI with cough and congestion and started on Augmentin  875 twice daily with food for 7 days.  Atrovent  nasal spray for nasal congestion.  Tessalon  Perles and Promethazine  DM cough syrup for cough and congestion.  I will refer her to podiatry for management of the foreign bodies in her left foot.   Final Clinical Impressions(s) / UC Diagnoses   Final diagnoses:  Subacute cough  URI with cough and congestion  Foreign body in left foot, initial encounter     Discharge Instructions      Take the Augmentin  twice daily with food for 7 days for treatment of your upper respiratory tract infection.  Continue to use your albuterol inhaler as needed for any shortness of breath or wheezing.  Use the Atrovent  nasal spray, 2 squirts up each nostril every 6 hours, as needed for nasal congestion, runny nose, and postnasal drip.  Use the Tessalon  Perles every 8 hours during the day as needed for cough.  Take them with a small sip of water.  They may give you numbness to the base of your tongue, or metallic taste in your mouth, this is normal.  Use the Promethazine   DM cough syrup at bedtime as needed for cough and congestion.  I have referred you to podiatry for further evaluation and management of the foreign bodies in the sole of your left foot.  They will contact you to  make an appointment.  Keep the wounds on the bottom of your foot clean and dry.  Also keep them open to air is much as possible to help decrease the chance of infection.  You may soak your foot in warm water and Epsom salts 2-3 times a day to see if you can get the foreign bodies to work themselves out.  If you develop any redness, swelling, pus drainage from the wounds, red streaks going up your foot, or fever you need to go to the ER for evaluation.     ED Prescriptions     Medication Sig Dispense Auth. Provider   amoxicillin -clavulanate (AUGMENTIN ) 875-125 MG tablet Take 1 tablet by mouth every 12 (twelve) hours for 7 days. 14 tablet Bernardino Ditch, NP   benzonatate  (TESSALON ) 100 MG capsule Take 2 capsules (200 mg total) by mouth every 8 (eight) hours. 21 capsule Bernardino Ditch, NP   ipratropium (ATROVENT ) 0.06 % nasal spray Place 2 sprays into both nostrils 4 (four) times daily. 15 mL Bernardino Ditch, NP   promethazine -dextromethorphan (PROMETHAZINE -DM) 6.25-15 MG/5ML syrup Take 5 mLs by mouth 4 (four) times daily as needed. 118 mL Bernardino Ditch, NP      PDMP not reviewed this encounter.   Bernardino Ditch, NP 07/01/24 1544    Bernardino Ditch, NP 07/01/24 1610

## 2024-07-01 NOTE — Discharge Instructions (Addendum)
 Take the Augmentin  twice daily with food for 7 days for treatment of your upper respiratory tract infection.  Continue to use your albuterol inhaler as needed for any shortness of breath or wheezing.  Use the Atrovent  nasal spray, 2 squirts up each nostril every 6 hours, as needed for nasal congestion, runny nose, and postnasal drip.  Use the Tessalon  Perles every 8 hours during the day as needed for cough.  Take them with a small sip of water.  They may give you numbness to the base of your tongue, or metallic taste in your mouth, this is normal.  Use the Promethazine  DM cough syrup at bedtime as needed for cough and congestion.  I have referred you to podiatry for further evaluation and management of the foreign bodies in the sole of your left foot.  They will contact you to make an appointment.  Keep the wounds on the bottom of your foot clean and dry.  Also keep them open to air is much as possible to help decrease the chance of infection.  You may soak your foot in warm water and Epsom salts 2-3 times a day to see if you can get the foreign bodies to work themselves out.  If you develop any redness, swelling, pus drainage from the wounds, red streaks going up your foot, or fever you need to go to the ER for evaluation.

## 2024-07-01 NOTE — ED Triage Notes (Signed)
 Patient states that she's been having sx since 8-19. Took z-pack and prednisone  for sx. But did get better. Patient states that she's having a sore throat on the left side. Coughing, SOB hx of asthma. Using inhalers. Fatigue   Left foot issue. Patient states that she was at the beach and thinks she has glass in her foot and something from the beach.

## 2024-07-04 NOTE — Progress Notes (Deleted)
  [facility] [address] [place] [address] Phone: [phone] Subjective:    I'm seeing this patient by the request  of:  [name], [name], MD  CC: Back and neck pain  [name]  Tristin Gladman Rebekah [name] is a 33 y.o. female coming in with complaint of back and neck pain. OMT 05/06/2024. Patient states   Medications patient has been prescribed: None  Taking:         Reviewed prior external information including notes and imaging from previsou exam, outside providers and external EMR if available.   As well as notes that were available from care everywhere and other healthcare systems.  Past medical history, social, surgical and family history all reviewed in electronic medical record.  No pertanent information unless stated regarding to the chief complaint.   Past Medical History:  Diagnosis Date   Asthma     Allergies  Allergen Reactions   Sumatriptan Succinate Other (See Comments)    Muscle spasms     Review of Systems:  No headache, visual changes, nausea, vomiting, diarrhea, constipation, dizziness, abdominal pain, skin rash, fevers, chills, night sweats, weight loss, swollen lymph nodes, body aches, joint swelling, chest pain, shortness of breath, mood changes. POSITIVE muscle aches  Objective  Last menstrual period 06/27/2024.   General: No apparent distress alert and oriented x3 mood and affect normal, dressed appropriately.  HEENT: Pupils equal, extraocular movements intact  Respiratory: Patient's speak in full sentences and does not appear short of breath  Cardiovascular: No lower extremity edema, non tender, no erythema  Gait MSK:  Back   Osteopathic findings  C2 flexed rotated and side bent right C6 flexed rotated and side bent left T3 extended rotated and side bent right inhaled rib T9 extended rotated and side bent left L2 flexed rotated and side bent right Sacrum right on right        Assessment and Plan:  No problem-specific Assessment & Plan notes found for this encounter.    Nonallopathic problems  Decision today to treat with OMT was based on Physical Exam  After verbal consent patient was treated with HVLA, ME, FPR techniques in cervical, rib, thoracic, lumbar, and sacral  areas  Patient tolerated the procedure well with improvement in symptoms  Patient given exercises, stretches and lifestyle modifications  See medications in patient instructions if given  Patient will follow up in 4-8 weeks    The above documentation has been reviewed and is accurate and complete Saniya Tranchina M Shaylah Mcghie, DO          Note: This dictation was prepared with Dragon dictation along with smaller phrase technology. Any transcriptional errors that result from this process are unintentional.

## 2024-07-07 ENCOUNTER — Ambulatory Visit: Admitting: Family Medicine

## 2024-08-07 NOTE — Progress Notes (Signed)
 [name] [address] [place] [address] Phone: [phone] Subjective:   Rebekah [name] am a scribe for Dr. Claudene.   I'm seeing this patient by the request  of:  [name], [name], MD  CC: back and neck pain follow up   [name]  [name] [name] is a 33 y.o. female coming in with complaint of back and neck pain. OMT on 05/06/2024. Patient states would like Dr. Claudene to look at her right thumb to see if anything is going on with it. Back and neck have been worse but it is a little bit better.   Medications patient has been prescribed:   Taking:         Reviewed prior external information including notes and imaging from previsou exam, outside providers and external EMR if available.   As well as notes that were available from care everywhere and other healthcare systems.  Past medical history, social, surgical and family history all reviewed in electronic medical record.  No pertanent information unless stated regarding to the chief complaint.   Past Medical History:  Diagnosis Date   Asthma     Allergies  Allergen Reactions   Sumatriptan Succinate Other (See Comments)    Muscle spasms     Review of Systems:  No headache, visual changes, nausea, vomiting, diarrhea, constipation, dizziness, abdominal pain, skin rash, fevers, chills, night sweats, weight loss, swollen lymph nodes, body aches, joint swelling, chest pain, shortness of breath, mood changes. POSITIVE muscle aches  Objective  Blood pressure 110/60, pulse 69, height 5' 1 (1.549 m), weight 134 lb (60.8 kg), SpO2 99%.   General: No apparent distress alert and oriented x3 mood and affect normal, dressed appropriately.  HEENT: Pupils equal, extraocular movements intact  Respiratory: Patient's speak in full sentences and does not appear short of breath  Cardiovascular: No lower extremity edema, non tender, no erythema  Gait MSK:  Back does have some  mild loss of lordosis.  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  Discussed icing regimen and home exercises, discussed which activities to do and which ones to avoid.  Increase activity slowly.  Patient has been sitting more frequently and will start to do more and increasing activity.  Has done relatively well though with weight loss.  Follow-up again in 6 to 8 weeks    Nonallopathic problems  Decision today to treat with OMT was based on Physical Exam  After verbal consent patient was treated with HVLA, ME, FPR techniques in cervical, rib, thoracic, lumbar, and sacral  areas  Patient tolerated the procedure well with improvement in symptoms  Patient given exercises, stretches and lifestyle modifications  See medications in patient instructions if given  Patient will follow up in 4-8 weeks    The above documentation has been reviewed and is accurate and complete Edwen Mclester M Gursimran Litaker, DO          Note: This dictation was prepared with Dragon dictation along with smaller phrase technology. Any  transcriptional errors that result from this process are unintentional.

## 2024-08-12 ENCOUNTER — Ambulatory Visit: Admitting: Family Medicine

## 2024-08-12 ENCOUNTER — Encounter: Payer: Self-pay | Admitting: Family Medicine

## 2024-08-12 VITALS — BP 110/60 | HR 69 | Ht 61.0 in | Wt 134.0 lb

## 2024-08-12 DIAGNOSIS — M9908 Segmental and somatic dysfunction of rib cage: Secondary | ICD-10-CM | POA: Diagnosis not present

## 2024-08-12 DIAGNOSIS — M9901 Segmental and somatic dysfunction of cervical region: Secondary | ICD-10-CM

## 2024-08-12 DIAGNOSIS — M9904 Segmental and somatic dysfunction of sacral region: Secondary | ICD-10-CM | POA: Diagnosis not present

## 2024-08-12 DIAGNOSIS — M5441 Lumbago with sciatica, right side: Secondary | ICD-10-CM

## 2024-08-12 DIAGNOSIS — M9902 Segmental and somatic dysfunction of thoracic region: Secondary | ICD-10-CM

## 2024-08-12 DIAGNOSIS — M9903 Segmental and somatic dysfunction of lumbar region: Secondary | ICD-10-CM | POA: Diagnosis not present

## 2024-08-12 DIAGNOSIS — M5442 Lumbago with sciatica, left side: Secondary | ICD-10-CM | POA: Diagnosis not present

## 2024-08-12 NOTE — Patient Instructions (Addendum)
 Good to see you.  Have a great birthday bash! See me again in 2 months.

## 2024-08-12 NOTE — Assessment & Plan Note (Signed)
 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  Discussed icing regimen and home exercises, discussed which activities to do and which ones to avoid.  Increase activity slowly.  Patient has been sitting more frequently and will start to do more and increasing activity.  Has done relatively well though with weight loss.  Follow-up again in 6 to 8 weeks

## 2024-10-09 NOTE — Progress Notes (Signed)
°  [facility] [address] [place] [address] Phone: [phone] Subjective:   Rebekah [name], am serving as a scribe for Dr. Arthea Claudene.  I'm seeing this patient by the request  of:  [name], [name], MD  CC: Back and neck pain follow-up  [name]  Rebekah [name] is a 33 y.o. female coming in with complaint of back and neck pain. OMT on 08/12/2024. Patient states that she is doing ok. Back pain is dependent on weather. No change in pain since last visit.  Patient does think it overall she is probably getting better.  Medications patient has been prescribed:   Taking:         Reviewed prior external information including notes and imaging from previsou exam, outside providers and external EMR if available.   As well as notes that were available from care everywhere and other healthcare systems.  Past medical history, social, surgical and family history all reviewed in electronic medical record.  No pertanent information unless stated regarding to the chief complaint.   Past Medical History:  Diagnosis Date   Asthma     Allergies[1]   Review of Systems:  No headache, visual changes, nausea, vomiting, diarrhea, constipation, dizziness, abdominal pain, skin rash, fevers, chills, night sweats, weight loss, swollen lymph nodes, body aches, joint swelling, chest pain, shortness of breath, mood changes. POSITIVE muscle aches  Objective  Blood pressure 118/84, pulse 90, height 5' 1 (1.549 m), weight 127 lb (57.6 kg), SpO2 98%.   General: No apparent distress alert and oriented x3 mood and affect normal, dressed appropriately.  HEENT: Pupils equal, extraocular movements intact  Respiratory: Patient's speak in full sentences and does not appear short of breath  Cardiovascular: No lower extremity edema, non tender, no erythema  Gait MSK:  Back tightness that is out of proportion.  Patient does have some voluntary  guarding noted.  Tightness with FABER right greater than left.  Osteopathic findings  C3 flexed rotated and side bent right C6 flexed rotated and side bent left T3 extended rotated and side bent right inhaled rib T7 extended rotated and side bent left L2 flexed rotated and side bent right L4 flexed rotated and side bent left Sacrum right on right     Assessment and Plan:  Low back pain Chronic problem that does still seem that pain is out of proportion.  Does respond well to osteopathic manipulation.  Increase activity slowly.  Discussed icing regimen.  Increase activity slowly.  Follow-up with me again in 6 to 8 weeks.    Nonallopathic problems  Decision today to treat with OMT was based on Physical Exam  After verbal consent patient was treated with HVLA, ME, FPR techniques in cervical, rib, thoracic, lumbar, and sacral  areas  Patient tolerated the procedure well with improvement in symptoms  Patient given exercises, stretches and lifestyle modifications  See medications in patient instructions if given  Patient will follow up in 4-8 weeks     The above documentation has been reviewed and is accurate and complete [name] M [name], DO         Note: This dictation was prepared with Dragon dictation along with smaller phrase technology. Any transcriptional errors that result from this process are unintentional.            [1]  Allergies Allergen Reactions   Sumatriptan Succinate Other (See Comments)    Muscle spasms

## 2024-10-13 ENCOUNTER — Ambulatory Visit: Admitting: Family Medicine

## 2024-10-13 VITALS — BP 118/84 | HR 90 | Ht 61.0 in | Wt 127.0 lb

## 2024-10-13 DIAGNOSIS — M9901 Segmental and somatic dysfunction of cervical region: Secondary | ICD-10-CM

## 2024-10-13 DIAGNOSIS — M9902 Segmental and somatic dysfunction of thoracic region: Secondary | ICD-10-CM

## 2024-10-13 DIAGNOSIS — M5442 Lumbago with sciatica, left side: Secondary | ICD-10-CM | POA: Diagnosis not present

## 2024-10-13 DIAGNOSIS — M9908 Segmental and somatic dysfunction of rib cage: Secondary | ICD-10-CM

## 2024-10-13 DIAGNOSIS — M5441 Lumbago with sciatica, right side: Secondary | ICD-10-CM

## 2024-10-13 DIAGNOSIS — M9903 Segmental and somatic dysfunction of lumbar region: Secondary | ICD-10-CM | POA: Diagnosis not present

## 2024-10-13 DIAGNOSIS — M9904 Segmental and somatic dysfunction of sacral region: Secondary | ICD-10-CM

## 2024-10-13 NOTE — Patient Instructions (Signed)
See me again in 6-7 weeks ?

## 2024-10-13 NOTE — Assessment & Plan Note (Signed)
 Chronic problem that does still seem that pain is out of proportion.  Does respond well to osteopathic manipulation.  Increase activity slowly.  Discussed icing regimen.  Increase activity slowly.  Follow-up with me again in 6 to 8 weeks.

## 2024-11-21 NOTE — Progress Notes (Unsigned)
" °  [facility] [address] [place] [address] Phone: [phone] Subjective:    [name] seeing this patient by the request  of:  [name], [name], MD  CC: Back and neck pain follow-up  YEP:Rebekah [name]  Rebekah [name] is a 34 y.o. female coming in with complaint of back and neck pain. OMT 10/13/2024. Patient states   Medications patient has been prescribed: None  Taking:         Reviewed prior external information including notes and imaging from previsou exam, outside providers and external EMR if available.   As well as notes that were available from care everywhere and other healthcare systems.  Past medical history, social, surgical and family history all reviewed in electronic medical record.  No pertanent information unless stated regarding to the chief complaint.   Past Medical History:  Diagnosis Date   Asthma     Allergies[1]   Review of Systems:  No headache, visual changes, nausea, vomiting, diarrhea, constipation, dizziness, abdominal pain, skin rash, fevers, chills, night sweats, weight loss, swollen lymph nodes, body aches, joint swelling, chest pain, shortness of breath, mood changes. POSITIVE muscle aches  Objective  There were no vitals taken for this visit.   General: No apparent distress alert and oriented x3 mood and affect normal, dressed appropriately.  HEENT: Pupils equal, extraocular movements intact  Respiratory: Patient's speak in full sentences and does not appear short of breath  Cardiovascular: No lower extremity edema, non tender, no erythema  Gait MSK:  Back   Osteopathic findings  C2 flexed rotated and side bent right C6 flexed rotated and side bent left T3 extended rotated and side bent right inhaled rib T9 extended rotated and side bent left L2 flexed rotated and side bent right Sacrum right on right       Assessment and Plan:  No problem-specific Assessment & Plan notes  found for this encounter.    Nonallopathic problems  Decision today to treat with OMT was based on Physical Exam  After verbal consent patient was treated with HVLA, ME, FPR techniques in cervical, rib, thoracic, lumbar, and sacral  areas  Patient tolerated the procedure well with improvement in symptoms  Patient given exercises, stretches and lifestyle modifications  See medications in patient instructions if given  Patient will follow up in 4-8 weeks    The above documentation has been reviewed and is accurate and complete Rebekah [name] Rebekah Holland, DO          Note: This dictation was prepared with Dragon dictation along with smaller phrase technology. Any transcriptional errors that result from this process are unintentional.            [1]  Allergies Allergen Reactions   Sumatriptan Succinate Other (See Comments)    Muscle spasms   "

## 2024-11-26 ENCOUNTER — Ambulatory Visit: Admitting: Family Medicine

## 2024-11-26 ENCOUNTER — Telehealth: Payer: Self-pay | Admitting: Family Medicine

## 2024-11-26 NOTE — Telephone Encounter (Signed)
 Patient called and stated that she has reached her limit on how many visits she could have with Dr Claudene with insurance. Her insurance told her that if she got a mental health diagnosis that she would have unlimited appointments. She was not sure if this was something Dr. Claudene could do or if this needed to come from a therapist.  Please call patient for further questions.

## 2024-12-03 NOTE — Telephone Encounter (Signed)
 Pt called, has not read our MyChart response.  Pt given MyChart response that we would be unable to make coding changes in this manner nor provider a letter on the subject.

## 2025-02-02 ENCOUNTER — Ambulatory Visit: Admitting: Family Medicine
